# Patient Record
Sex: Female | Born: 2000 | Race: White | Hispanic: No | Marital: Single | Smoking: Never smoker
Health system: Southern US, Community
[De-identification: ages and names within clinical notes are randomized; demographics above are authoritative.]

## PROBLEM LIST (undated history)

## (undated) DIAGNOSIS — J302 Other seasonal allergic rhinitis: Secondary | ICD-10-CM

## (undated) DIAGNOSIS — J4520 Mild intermittent asthma, uncomplicated: Secondary | ICD-10-CM

## (undated) HISTORY — DX: Mild intermittent asthma, uncomplicated: J45.20

## (undated) HISTORY — PX: FRACTURE SURGERY: SHX138

## (undated) HISTORY — PX: ELBOW SURGERY: SHX618

## (undated) HISTORY — DX: Other seasonal allergic rhinitis: J30.2

---
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
## 2021-03-20 ENCOUNTER — Other Ambulatory Visit: Payer: Self-pay | Admitting: Nurse Practitioner

## 2021-03-20 NOTE — Telephone Encounter (Signed)
Attempted to call patient per protocol- patient is over appointment. Left message to call office- patient does have an appointment scheduled with provider closer to her home 03/27/21. Need to verify care plan for possible bridge Rx to appointment- or make appointment in office.

## 2021-03-20 NOTE — Telephone Encounter (Signed)
Unable to send courtesy 28 day RF-original Rx as print.

## 2021-03-20 NOTE — Telephone Encounter (Signed)
Courtesy 28 day cycle given

## 2021-03-27 ENCOUNTER — Ambulatory Visit: Payer: Medicaid Other | Admitting: Internal Medicine

## 2021-05-20 ENCOUNTER — Ambulatory Visit: Payer: Medicaid Other | Admitting: Internal Medicine

## 2021-07-14 ENCOUNTER — Other Ambulatory Visit: Payer: Self-pay | Admitting: Nurse Practitioner

## 2021-07-14 NOTE — Telephone Encounter (Signed)
Call to patient - appointment scheduled- courtesy RF 1 cycle given Requested Prescriptions  Pending Prescriptions Disp Refills  . TRI-ESTARYLLA 0.18/0.215/0.25 MG-35 MCG tablet [Pharmacy Med Name: TRI-ESTARYLLA TABLET] 84 tablet 1    Sig: TAKE 1 TABLET BY MOUTH EVERY DAY     OB/GYN:  Contraceptives Failed - 07/14/2021 12:39 PM      Failed - Valid encounter within last 12 months    Recent Outpatient Visits          1 year ago Need for influenza vaccination   [facility] Caro Laroche, DO   1 year ago Acute gastritis, presence of bleeding unspecified, unspecified gastritis type   [facility], Megan P, DO   2 years ago Moderate persistent asthma without complication   [facility] Roosvelt Maser [place], [place]   2 years ago Moderate persistent asthma without complication   [facility] Particia [place], [place]   3 years ago Nausea and vomiting, intractability of vomiting not specified, unspecified vomiting type   [facility], [place], [place]      Future Appointments            In 3 weeks Larae Grooms, NP [facility]           Passed - Last BP in normal range    BP Readings from Last 1 Encounters:  01/17/20 116/81         Passed - Patient is not a smoker

## 2021-07-14 NOTE — Telephone Encounter (Signed)
Call to patient- she has not been able to find provider locally so she schedule annual exam at [facility]. Unable to fill courtesy RF due to print status of Rx- sent to office to see if patient can have 1 cycle of OCP until her appointment.

## 2021-08-04 NOTE — Progress Notes (Deleted)
There were no vitals taken for this visit.   Subjective:    Patient ID: Krystal Carroll, female    DOB: 03-02-2000, 21 y.o.   MRN: [id number]  HPI: Krystal Carroll is a 21 y.o. female presenting on 08/05/2021 for comprehensive medical examination. Current medical complaints include:{Blank single:19197::"none","***"}  She currently lives with: Menopausal Symptoms: {Blank single:19197::"yes","no"}  Depression Screen done today and results listed below:     01/17/2020    3:03 PM 04/11/2018   10:44 AM 11/26/2017    4:36 PM 10/28/2016    3:05 PM 07/22/2016   12:57 PM  Depression screen PHQ 2/9  Decreased Interest 0 1 2 2 2   Down, Depressed, Hopeless 0 2 3 3 3   PHQ - 2 Score 0 3 5 5 5   Altered sleeping 2 3 1 2 3   Tired, decreased energy 0 3 3 3 3   Change in appetite 3 2 3 3 3   Feeling bad or failure about yourself  1 2 2 3 3   Trouble concentrating 3 1 2 3 2   Moving slowly or fidgety/restless 0 2 3 3 3   Suicidal thoughts 0 1 1 2 3   PHQ-9 Score 9 17 20 24 25   Difficult doing work/chores Very difficult        The patient {has/does not have:19849} a history of falls. I {did/did not:19850} complete a risk assessment for falls. A plan of care for falls {was/was not:19852} documented.   Past Medical History:  Past Medical History:  Diagnosis Date   Mild intermittent asthma    Seasonal allergies     Surgical History:  Past Surgical History:  Procedure Laterality Date   ELBOW SURGERY Left    FRACTURE SURGERY     broken arm    Medications:  Current Outpatient Medications on File Prior to Visit  Medication Sig   albuterol (PROVENTIL HFA;VENTOLIN HFA) 108 (90 Base) MCG/ACT inhaler Inhale 1-2 puffs into the lungs every 6 (six) hours as needed for wheezing or shortness of breath.   Fluticasone-Salmeterol (ADVAIR) 100-50 MCG/DOSE AEPB Inhale 1 puff into the lungs 2 (two) times daily.   omeprazole (PRILOSEC) 20 MG capsule Take 1 capsule (20 mg total) by mouth daily.   ondansetron  (ZOFRAN ODT) 4 MG disintegrating tablet Take 1 tablet (4 mg total) by mouth every 8 (eight) hours as needed for nausea or vomiting.   polyethylene glycol powder (GLYCOLAX/MIRALAX) 17 GM/SCOOP powder Mix one 17gram scoop in one 8oz glass of water. Drink this daily/   sucralfate (CARAFATE) 1 g tablet Take 1 tablet (1 g total) by mouth 4 (four) times daily -  with meals and at bedtime.   TRI-ESTARYLLA 0.18/0.215/0.25 MG-35 MCG tablet TAKE 1 TABLET BY MOUTH EVERY DAY   venlafaxine XR (EFFEXOR-XR) 37.5 MG 24 hr capsule TAKE 1 CAPSULE BY MOUTH DAILY WITH BREAKFAST. (Patient not taking: Reported on 01/17/2020)   No current facility-administered medications on file prior to visit.    Allergies:  Allergies  Allergen Reactions   Claritin [Loratadine] Rash    Social History:  Social History   Socioeconomic History   Marital status: Single    Spouse name: Not on file   Number of children: Not on file   Years of education: Not on file   Highest education level: Not on file  Occupational History   Not on file  Tobacco Use   Smoking status: Passive Smoke Exposure - Never Smoker   Smokeless tobacco: Never  Vaping Use   Vaping Use:  Never used  Substance and Sexual Activity   Alcohol use: No   Drug use: No   Sexual activity: Not on file  Other Topics Concern   Not on file  Social History Narrative   Not on file   Social Determinants of Health   Financial Resource Strain: Not on file  Food Insecurity: Not on file  Transportation Needs: Not on file  Physical Activity: Not on file  Stress: Not on file  Social Connections: Not on file  Intimate Partner Violence: Not on file   Social History   Tobacco Use  Smoking Status Passive Smoke Exposure - Never Smoker  Smokeless Tobacco Never   Social History   Substance and Sexual Activity  Alcohol Use No    Family History:  Family History  Problem Relation Age of Onset   Diabetes Paternal Grandmother    Multiple sclerosis Paternal  Grandmother    Heart disease Neg Hx    Stroke Neg Hx     Past medical history, surgical history, medications, allergies, family history and social history reviewed with patient today and changes made to appropriate areas of the chart.   ROS All other ROS negative except what is listed above and in the HPI.      Objective:    There were no vitals taken for this visit.  Wt Readings from Last 3 Encounters:  01/17/20 145 lb 6.4 oz (66 kg) (79 %, Z= 0.82)*  06/21/19 146 lb (66.2 kg) (81 %, Z= 0.89)*  05/22/19 149 lb (67.6 kg) (84 %, Z= 0.99)*   * Growth percentiles are based on CDC (Girls, 2-20 Years) data.    Physical Exam  Results for orders placed or performed in visit on 01/17/20  CBC with Differential/Platelet  Result Value Ref Range   WBC 8.7 3.4 - 10.8 x10E3/uL   RBC 3.97 3.77 - 5.28 x10E6/uL   Hemoglobin 12.5 11.1 - 15.9 g/dL   Hematocrit 94.4 96.7 - 46.6 %   MCV 93 79 - 97 fL   MCH 31.5 26.6 - 33.0 pg   MCHC 33.9 31.5 - 35.7 g/dL   RDW 59.1 63.8 - 46.6 %   Platelets 223 150 - 450 x10E3/uL   Neutrophils 66 Not Estab. %   Lymphs 28 Not Estab. %   Monocytes 5 Not Estab. %   Eos 1 Not Estab. %   Basos 0 Not Estab. %   Neutrophils Absolute 5.6 1.4 - 7.0 x10E3/uL   Lymphocytes Absolute 2.4 0.7 - 3.1 x10E3/uL   Monocytes Absolute 0.5 0.1 - 0.9 x10E3/uL   EOS (ABSOLUTE) 0.1 0.0 - 0.4 x10E3/uL   Basophils Absolute 0.0 0.0 - 0.2 x10E3/uL   Immature Granulocytes 0 Not Estab. %   Immature Grans (Abs) 0.0 0.0 - 0.1 x10E3/uL  Comprehensive metabolic panel  Result Value Ref Range   Glucose 100 (H) 65 - 99 mg/dL   BUN 10 6 - 20 mg/dL   Creatinine, Ser 5.99 0.57 - 1.00 mg/dL   GFR calc non Af Amer 110 >59 mL/min/1.73   GFR calc Af Amer 126 >59 mL/min/1.73   BUN/Creatinine Ratio 13 9 - 23   Sodium 141 134 - 144 mmol/L   Potassium 4.3 3.5 - 5.2 mmol/L   Chloride 102 96 - 106 mmol/L   CO2 24 20 - 29 mmol/L   Calcium 9.1 8.7 - 10.2 mg/dL   Total Protein 7.1 6.0 - 8.5 g/dL    Albumin 4.2 3.9 - 5.0 g/dL   Globulin, Total  2.9 1.5 - 4.5 g/dL   Albumin/Globulin Ratio 1.4 1.2 - 2.2   Bilirubin Total 0.6 0.0 - 1.2 mg/dL   Alkaline Phosphatase 96 42 - 106 IU/L   AST 12 0 - 40 IU/L   ALT 11 0 - 32 IU/L      Assessment & Plan:   Problem List Items Addressed This Visit   None    Follow up plan: No follow-ups on file.   LABORATORY TESTING:  - Pap smear: {Blank single:19197::"pap done","not applicable","up to date","done elsewhere"}  IMMUNIZATIONS:   - Tdap: Tetanus vaccination status reviewed: {tetanus status:315746}. - Influenza: {Blank single:19197::"Up to date","Administered today","Postponed to flu season","Refused","Given elsewhere"} - Pneumovax: {Blank single:19197::"Up to date","Administered today","Not applicable","Refused","Given elsewhere"} - Prevnar: {Blank single:19197::"Up to date","Administered today","Not applicable","Refused","Given elsewhere"} - COVID: {Blank single:19197::"Up to date","Administered today","Not applicable","Refused","Given elsewhere"} - HPV: {Blank single:19197::"Up to date","Administered today","Not applicable","Refused","Given elsewhere"} - Shingrix vaccine: {Blank single:19197::"Up to date","Administered today","Not applicable","Refused","Given elsewhere"}  SCREENING: -Mammogram: {Blank single:19197::"Up to date","Ordered today","Not applicable","Refused","Done elsewhere"}  - Colonoscopy: {Blank single:19197::"Up to date","Ordered today","Not applicable","Refused","Done elsewhere"}  - Bone Density: {Blank single:19197::"Up to date","Ordered today","Not applicable","Refused","Done elsewhere"}  -Hearing Test: {Blank single:19197::"Up to date","Ordered today","Not applicable","Refused","Done elsewhere"}  -Spirometry: {Blank single:19197::"Up to date","Ordered today","Not applicable","Refused","Done elsewhere"}   PATIENT COUNSELING:   Advised to take 1 mg of folate supplement per day if capable of pregnancy.   Sexuality:  Discussed sexually transmitted diseases, partner selection, use of condoms, avoidance of unintended pregnancy  and contraceptive alternatives.   Advised to avoid cigarette smoking.  I discussed with the patient that most people either abstain from alcohol or drink within safe limits (<=14/week and <=4 drinks/occasion for males, <=7/weeks and <= 3 drinks/occasion for females) and that the risk for alcohol disorders and other health effects rises proportionally with the number of drinks per week and how often a drinker exceeds daily limits.  Discussed cessation/primary prevention of drug use and availability of treatment for abuse.   Diet: Encouraged to adjust caloric intake to maintain  or achieve ideal body weight, to reduce intake of dietary saturated fat and total fat, to limit sodium intake by avoiding high sodium foods and not adding table salt, and to maintain adequate dietary potassium and calcium preferably from fresh fruits, vegetables, and low-fat dairy products.    stressed the importance of regular exercise  Injury prevention: Discussed safety belts, safety helmets, smoke detector, smoking near bedding or upholstery.   Dental health: Discussed importance of regular tooth brushing, flossing, and dental visits.    NEXT PREVENTATIVE PHYSICAL DUE IN 1 YEAR. No follow-ups on file.

## 2021-08-05 ENCOUNTER — Encounter: Payer: Self-pay | Admitting: Nurse Practitioner

## 2022-07-14 ENCOUNTER — Telehealth: Payer: Self-pay

## 2022-07-14 NOTE — Telephone Encounter (Signed)
LVM for patient to call back [phone], or to call PCP office to schedule follow up apt. AS, CMA  

## 2023-04-13 ENCOUNTER — Ambulatory Visit: Payer: Medicaid Other | Admitting: Family Medicine

## 2023-05-20 ENCOUNTER — Encounter: Payer: Self-pay | Admitting: Family Medicine

## 2023-05-20 ENCOUNTER — Ambulatory Visit (INDEPENDENT_AMBULATORY_CARE_PROVIDER_SITE_OTHER): Payer: Medicaid Other | Admitting: Family Medicine

## 2023-05-20 ENCOUNTER — Ambulatory Visit (HOSPITAL_COMMUNITY)
Admission: RE | Admit: 2023-05-20 | Discharge: 2023-05-20 | Disposition: A | Discharge status: Home / Self Care | Source: Ambulatory Visit | Attending: Family Medicine | Admitting: Family Medicine

## 2023-05-20 VITALS — BP 123/82 | HR 73 | Resp 18 | Ht 60.0 in | Wt 112.1 lb

## 2023-05-20 DIAGNOSIS — Z114 Encounter for screening for human immunodeficiency virus [HIV]: Secondary | ICD-10-CM

## 2023-05-20 DIAGNOSIS — R7301 Impaired fasting glucose: Secondary | ICD-10-CM | POA: Diagnosis not present

## 2023-05-20 DIAGNOSIS — M545 Low back pain, unspecified: Secondary | ICD-10-CM | POA: Diagnosis present

## 2023-05-20 DIAGNOSIS — E559 Vitamin D deficiency, unspecified: Secondary | ICD-10-CM | POA: Diagnosis not present

## 2023-05-20 DIAGNOSIS — Z1159 Encounter for screening for other viral diseases: Secondary | ICD-10-CM

## 2023-05-20 DIAGNOSIS — E038 Other specified hypothyroidism: Secondary | ICD-10-CM

## 2023-05-20 DIAGNOSIS — E7849 Other hyperlipidemia: Secondary | ICD-10-CM

## 2023-05-20 NOTE — Progress Notes (Signed)
 Established Patient Office Visit  Subjective:  Patient ID: Krystal Carroll, female    DOB: 01/16/2001  Age: 23 y.o. MRN: [id number]  CC:  Chief Complaint  Patient presents with   Establish Care    New pt appt    Back Pain    Complains of intermittent sharp pain in lower back which radiates in both legs. Had an injury in 2021, states the pain has been getting more severe.      HPI Krystal Carroll is a 23 y.o. female with past medical history of asthma presents for f/u of  chronic medical conditions. For the details of today's visit, please refer to the assessment and plan.     Past Medical History:  Diagnosis Date   Mild intermittent asthma    Seasonal allergies     Past Surgical History:  Procedure Laterality Date   ELBOW SURGERY Left    FRACTURE SURGERY     broken arm    Family History  Problem Relation Age of Onset   Diabetes Paternal Grandmother    Multiple sclerosis Paternal Grandmother    Heart disease Neg Hx    Stroke Neg Hx     Social History   Socioeconomic History   Marital status: Single    Spouse name: Not on file   Number of children: Not on file   Years of education: Not on file   Highest education level: Not on file  Occupational History   Not on file  Tobacco Use   Smoking status: Never    Passive exposure: Yes   Smokeless tobacco: Never  Vaping Use   Vaping status: Never Used  Substance and Sexual Activity   Alcohol use: Yes    Comment: occasional   Drug use: Yes    Types: Marijuana   Sexual activity: Yes  Other Topics Concern   Not on file  Social History Narrative   Not on file   Social Drivers of Health   Financial Resource Strain: Not on file  Food Insecurity: No Food Insecurity (05/20/2023)   Hunger Vital Sign    Worried About Running Out of Food in the Last Year: Never true    Ran Out of Food in the Last Year: Never true  Transportation Needs: Unmet Transportation Needs (05/20/2023)   PRAPARE - Transportation    Lack of  Transportation (Medical): Yes    Lack of Transportation (Non-Medical): Yes  Physical Activity: Inactive (05/20/2023)   Exercise Vital Sign    Days of Exercise per Week: 0 days    Minutes of Exercise per Session: 0 min  Stress: Not on file  Social Connections: Not on file  Intimate Partner Violence: Not At Risk (05/20/2023)   Humiliation, Afraid, Rape, and Kick questionnaire    Fear of Current or Ex-Partner: No    Emotionally Abused: No    Physically Abused: No    Sexually Abused: No    Outpatient Medications Prior to Visit  Medication Sig Dispense Refill   albuterol  (PROVENTIL  HFA;VENTOLIN  HFA) 108 (90 Base) MCG/ACT inhaler Inhale 1-2 puffs into the lungs every 6 (six) hours as needed for wheezing or shortness of breath. (Patient not taking: Reported on 05/20/2023) 1 Inhaler 0   Fluticasone -Salmeterol (ADVAIR) 100-50 MCG/DOSE AEPB Inhale 1 puff into the lungs 2 (two) times daily. (Patient not taking: Reported on 05/20/2023) 1 each 5   omeprazole  (PRILOSEC) 20 MG capsule Take 1 capsule (20 mg total) by mouth daily. (Patient not taking: Reported on 05/20/2023) 30  capsule 3   ondansetron  (ZOFRAN  ODT) 4 MG disintegrating tablet Take 1 tablet (4 mg total) by mouth every 8 (eight) hours as needed for nausea or vomiting. (Patient not taking: Reported on 05/20/2023) 20 tablet 1   polyethylene glycol powder (GLYCOLAX /MIRALAX ) 17 GM/SCOOP powder Mix one 17gram scoop in one 8oz glass of water. Drink this daily/ (Patient not taking: Reported on 05/20/2023) 578 g 1   sucralfate  (CARAFATE ) 1 g tablet Take 1 tablet (1 g total) by mouth 4 (four) times daily -  with meals and at bedtime. (Patient not taking: Reported on 05/20/2023) 120 tablet 1   TRI-ESTARYLLA 0.18/0.215/0.25 MG-35 MCG tablet TAKE 1 TABLET BY MOUTH EVERY DAY (Patient not taking: Reported on 05/20/2023) 28 tablet 0   venlafaxine  XR (EFFEXOR -XR) 37.5 MG 24 hr capsule TAKE 1 CAPSULE BY MOUTH DAILY WITH BREAKFAST. (Patient not taking: Reported on  05/20/2023) 10 capsule 0   No facility-administered medications prior to visit.    Allergies  Allergen Reactions   Claritin [Loratadine] Rash    ROS Review of Systems  Constitutional:  Negative for chills and fever.  Eyes:  Negative for visual disturbance.  Respiratory:  Negative for chest tightness and shortness of breath.   Musculoskeletal:  Positive for back pain.  Neurological:  Negative for dizziness and headaches.      Objective:    Physical Exam HENT:     Head: Normocephalic.     Mouth/Throat:     Mouth: Mucous membranes are moist.  Cardiovascular:     Rate and Rhythm: Normal rate.     Heart sounds: Normal heart sounds.  Pulmonary:     Effort: Pulmonary effort is normal.     Breath sounds: Normal breath sounds.  Musculoskeletal:     Lumbar back: No tenderness. Negative right straight leg raise test and negative left straight leg raise test.  Neurological:     Mental Status: She is alert.     BP 123/82   Pulse 73   Resp 18   Ht 5' (1.524 m)   Wt 112 lb 1.9 oz (50.9 kg)   LMP 05/13/2023 (Exact Date)   SpO2 98%   BMI 21.90 kg/m  Wt Readings from Last 3 Encounters:  05/20/23 112 lb 1.9 oz (50.9 kg)  01/17/20 145 lb 6.4 oz (66 kg) (79%, Z= 0.82)*  06/21/19 146 lb (66.2 kg) (81%, Z= 0.89)*   * Growth percentiles are based on CDC (Girls, 2-20 Years) data.    No results found for: "TSH" Lab Results  Component Value Date   WBC 8.7 01/17/2020   HGB 12.5 01/17/2020   HCT 36.9 01/17/2020   MCV 93 01/17/2020   PLT 223 01/17/2020   Lab Results  Component Value Date   NA 141 01/17/2020   K 4.3 01/17/2020   CO2 24 01/17/2020   GLUCOSE 100 (H) 01/17/2020   BUN 10 01/17/2020   CREATININE 0.79 01/17/2020   BILITOT 0.6 01/17/2020   ALKPHOS 96 01/17/2020   AST 12 01/17/2020   ALT 11 01/17/2020   PROT 7.1 01/17/2020   ALBUMIN 4.2 01/17/2020   CALCIUM 9.1 01/17/2020   ANIONGAP 3 (L) 05/18/2013   No results found for: "CHOL" No results found for:  "HDL" No results found for: "LDLCALC" No results found for: "TRIG" No results found for: "CHOLHDL" No results found for: "HGBA1C"    Assessment & Plan:  Lumbar pain Assessment & Plan: The patient reports a long-standing history of lower back pain since 2021, which began  after completing a 5-mile walk. She has a family history of rheumatoid arthritis and is concerned she may have a similar condition. The pain is described at times as dull, sharp, and occasionally radiating down the lower leg. She denies any recent injury or trauma and reports no lower back pain today in the clinic. A previously completed rheumatoid panel was negative.  An imaging study of the lower back will be obtained to rule out structural abnormalities. Nonpharmacological interventions were discussed, including the application of heat, the use of topical therapies, rest, and activity modification. No red flag symptoms (e.g., bowel/bladder dysfunction, progressive weakness, fever, or unexplained weight loss) were reported at this time.   Orders: -     DG Lumbar Spine Complete -     Ambulatory referral to Physical Therapy -     Rheumatoid factor  IFG (impaired fasting glucose) -     Hemoglobin A1c  Vitamin D deficiency -     VITAMIN D 25 Hydroxy (Vit-D Deficiency, Fractures)  Need for hepatitis C screening test -     Hepatitis C antibody  Encounter for screening for HIV -     HIV Antibody (routine testing w rflx)  TSH (thyroid-stimulating hormone deficiency) -     TSH + free T4  Other hyperlipidemia -     Lipid panel -     CMP14+EGFR -     CBC with Differential/Platelet  Note: This chart has been completed using Engineer, civil (consulting) software, and while attempts have been made to ensure accuracy, certain words and phrases may not be transcribed as intended.    Follow-up: Return in about 1 month (around 06/19/2023) for pap smear.   Paidyn Mcferran, FNP

## 2023-05-20 NOTE — Patient Instructions (Addendum)
 I appreciate the opportunity to provide care to you today!    Follow up:  1 months for pap smear  Labs: please stop by the lab today to get your blood drawn (CBC, CMP, TSH, Lipid profile, HgA1c, Vit D)  Screening: HIV and Hep C  Low back pain Please stop by [facility] to get a x-ray of your lower back  Here are several nonpharmacological interventions for managing back pain: -Physical Activity: Engage in regular low-impact exercises such as walking, swimming, or cycling to strengthen the back muscles and improve flexibility. -Stretching and Strengthening Exercises: Incorporate exercises that focus on strengthening the core and back muscles, as well as stretches to improve flexibility. Specific exercises can include: Cat-Cow stretch Child's pose Knee-to-chest stretch Hamstring stretches Hot and Cold Therapy: -Cold Therapy: Apply ice packs to the affected area for 15-20 minutes to reduce inflammation and numb the pain. -Heat Therapy: After the initial inflammation has subsided, use heat packs or warm baths to relax tight muscles and improve circulation. -Posture Improvement: Maintain proper posture while sitting, standing, and lifting to avoid additional strain on the back. Ergonomic chairs and lumbar support can help. -Weight Management: Maintain a healthy weight to reduce stress on the spine and lower back. -Sleep Hygiene: Ensure proper sleep positioning by using supportive pillows and mattresses to maintain spinal alignment during sleep.   Referrals today-  Physical therapy  Attached with your AVS, you will find valuable resources for self-education. I highly recommend dedicating some time to thoroughly examine them.   Please continue to a heart-healthy diet and increase your physical activities. Try to exercise for at least five days a week.    It was a pleasure to see you and I look forward to continuing to work together on your health and well-being. Please do not  hesitate to call the office if you need care or have questions about your care.  In case of emergency, please visit the Emergency Department for urgent care, or contact our clinic at [phone] to schedule an appointment. We're here to help you!   Have a wonderful day and week. With Gratitude, Latravious Levitt MSN, FNP-BC

## 2023-05-20 NOTE — Assessment & Plan Note (Signed)
 The patient reports a long-standing history of lower back pain since 2021, which began after completing a 5-mile walk. She has a family history of rheumatoid arthritis and is concerned she may have a similar condition. The pain is described at times as dull, sharp, and occasionally radiating down the lower leg. She denies any recent injury or trauma and reports no lower back pain today in the clinic. A previously completed rheumatoid panel was negative.  An imaging study of the lower back will be obtained to rule out structural abnormalities. Nonpharmacological interventions were discussed, including the application of heat, the use of topical therapies, rest, and activity modification. No red flag symptoms (e.g., bowel/bladder dysfunction, progressive weakness, fever, or unexplained weight loss) were reported at this time.

## 2023-05-21 ENCOUNTER — Encounter: Payer: Self-pay | Admitting: Family Medicine

## 2023-05-21 ENCOUNTER — Other Ambulatory Visit: Payer: Self-pay | Admitting: Family Medicine

## 2023-05-21 DIAGNOSIS — E559 Vitamin D deficiency, unspecified: Secondary | ICD-10-CM

## 2023-05-21 LAB — CBC WITH DIFFERENTIAL/PLATELET
Basophils Absolute: 0.1 x10E3/uL (ref 0.0–0.2)
Basos: 1 %
EOS (ABSOLUTE): 0.2 x10E3/uL (ref 0.0–0.4)
Eos: 2 %
Hematocrit: 38.9 % (ref 34.0–46.6)
Hemoglobin: 12.8 g/dL (ref 11.1–15.9)
Immature Grans (Abs): 0.1 x10E3/uL (ref 0.0–0.1)
Immature Granulocytes: 1 %
Lymphocytes Absolute: 1.9 x10E3/uL (ref 0.7–3.1)
Lymphs: 27 %
MCH: 30.9 pg (ref 26.6–33.0)
MCHC: 32.9 g/dL (ref 31.5–35.7)
MCV: 94 fL (ref 79–97)
Monocytes Absolute: 0.5 x10E3/uL (ref 0.1–0.9)
Monocytes: 7 %
Neutrophils Absolute: 4.3 x10E3/uL (ref 1.4–7.0)
Neutrophils: 62 %
Platelets: 244 x10E3/uL (ref 150–450)
RBC: 4.14 x10E6/uL (ref 3.77–5.28)
RDW: 12.7 % (ref 11.7–15.4)
WBC: 7 x10E3/uL (ref 3.4–10.8)

## 2023-05-21 LAB — LIPID PANEL
Chol/HDL Ratio: 2.8 ratio (ref 0.0–4.4)
Cholesterol, Total: 147 mg/dL (ref 100–199)
HDL: 52 mg/dL (ref >39)
LDL Chol Calc (NIH): 83 mg/dL (ref 0–99)
Triglycerides: 59 mg/dL (ref 0–149)
VLDL Cholesterol Cal: 12 mg/dL (ref 5–40)

## 2023-05-21 LAB — CMP14+EGFR
ALT: 17 IU/L (ref 0–32)
AST: 16 IU/L (ref 0–40)
Albumin: 4.5 g/dL (ref 4.0–5.0)
Alkaline Phosphatase: 93 IU/L (ref 44–121)
BUN/Creatinine Ratio: 16 (ref 9–23)
BUN: 12 mg/dL (ref 6–20)
Bilirubin Total: 1.7 mg/dL — ABNORMAL HIGH (ref 0.0–1.2)
CO2: 24 mmol/L (ref 20–29)
Calcium: 9.5 mg/dL (ref 8.7–10.2)
Chloride: 102 mmol/L (ref 96–106)
Creatinine, Ser: 0.73 mg/dL (ref 0.57–1.00)
Globulin, Total: 2.8 g/dL (ref 1.5–4.5)
Glucose: 80 mg/dL (ref 70–99)
Potassium: 4.7 mmol/L (ref 3.5–5.2)
Sodium: 140 mmol/L (ref 134–144)
Total Protein: 7.3 g/dL (ref 6.0–8.5)
eGFR: 120 mL/min/1.73 (ref >59)

## 2023-05-21 LAB — TSH+FREE T4
Free T4: 1.1 ng/dL (ref 0.82–1.77)
TSH: 3.61 u[IU]/mL (ref 0.450–4.500)

## 2023-05-21 LAB — HEMOGLOBIN A1C
Est. average glucose Bld gHb Est-mCnc: 103 mg/dL
Hgb A1c MFr Bld: 5.2 % (ref 4.8–5.6)

## 2023-05-21 LAB — HIV ANTIBODY (ROUTINE TESTING W REFLEX): HIV Screen 4th Generation wRfx: NONREACTIVE

## 2023-05-21 LAB — HEPATITIS C ANTIBODY: Hep C Virus Ab: NONREACTIVE

## 2023-05-21 LAB — VITAMIN D 25 HYDROXY (VIT D DEFICIENCY, FRACTURES): Vit D, 25-Hydroxy: 14 ng/mL — ABNORMAL LOW (ref 30.0–100.0)

## 2023-05-21 MED ORDER — VITAMIN D (ERGOCALCIFEROL) 1.25 MG (50000 UNIT) PO CAPS: 50000.0000 [IU] | ORAL_CAPSULE | ORAL | 1 refills | Status: AC

## 2023-05-28 ENCOUNTER — Encounter: Payer: Self-pay | Admitting: Family Medicine

## 2023-07-08 ENCOUNTER — Ambulatory Visit (HOSPITAL_COMMUNITY): Attending: Family Medicine

## 2023-07-08 ENCOUNTER — Other Ambulatory Visit: Payer: Self-pay

## 2023-07-08 ENCOUNTER — Encounter (HOSPITAL_COMMUNITY): Payer: Self-pay

## 2023-07-08 DIAGNOSIS — M545 Low back pain, unspecified: Secondary | ICD-10-CM | POA: Insufficient documentation

## 2023-07-08 DIAGNOSIS — M5459 Other low back pain: Secondary | ICD-10-CM | POA: Diagnosis present

## 2023-07-08 DIAGNOSIS — Z7409 Other reduced mobility: Secondary | ICD-10-CM | POA: Diagnosis present

## 2023-07-08 DIAGNOSIS — R29898 Other symptoms and signs involving the musculoskeletal system: Secondary | ICD-10-CM | POA: Diagnosis present

## 2023-07-08 NOTE — Therapy (Signed)
 OUTPATIENT PHYSICAL THERAPY THORACOLUMBAR EVALUATION   Patient Name: Krystal Carroll MRN: [id number] DOB:2000/09/14, 23 y.o., female Today's Date: 07/08/2023  END OF SESSION:  PT End of Session - 07/08/23 1447     Visit Number 1    Date for PT Re-Evaluation 08/20/23    Authorization Type [place] MEDICAID [employer]    Authorization Time Period seeking auth    Authorization - Visit Number 0    Progress Note Due on Visit 10    PT Start Time 1300    PT Stop Time 1344    PT Time Calculation (min) 44 min    Activity Tolerance Patient tolerated treatment well;Patient limited by pain    Behavior During Therapy [facility] for tasks assessed/performed          Past Medical History:  Diagnosis Date   Mild intermittent asthma    Seasonal allergies    Past Surgical History:  Procedure Laterality Date   ELBOW SURGERY Left    FRACTURE SURGERY     broken arm   Patient Active Problem List   Diagnosis Date Noted   Lumbar pain 05/20/2023   GERD (gastroesophageal reflux disease) 01/17/2020   Seasonal allergies 06/21/2019   Encounter for surveillance of contraceptive pills 06/04/2019   Insomnia 11/26/2017   Depression, recurrent (HCC) 07/24/2016   Moderate asthma     PCP: Zarwolo, Gloria, FNP  REFERRING PROVIDER: Zarwolo, Gloria, FNP  REFERRING DIAG: M54.50 (ICD-10-CM) - Lumbar pain  Rationale for Evaluation and Treatment: Rehabilitation  THERAPY DIAG:  Other low back pain  Weakness of both lower extremities  Impaired functional mobility and activity tolerance  ONSET DATE: Years  SUBJECTIVE:                                                                                                                                                                                           SUBJECTIVE STATEMENT: Pt states that she has dealt with back pain since middle school. Vitamin D2 just added to medication list. Pt states she was working at [employer] and had to lift stuff  over head, now pt works at [employer] and its better but has to stand for long periods. Pt mentions sciatica was a word her doctor used last appointment. Pain in left leg today but has gone down right leg before. Pt states sciatica pain started on a trip where she had to walk over 5 miles, this is when she started complaining about the pain everyday. Significant other states pain is the worst in the morning, and after work at night. Pt asks about best sleeping position.  PERTINENT HISTORY:  -Broke arm twice,  abnormal sensation in LUE  PAIN:  Are you having pain? Yes: NPRS scale: 3/10, 7/10 at worst Pain location: left leg Pain description: burning, shooting Aggravating factors: sleeping position, long walking, going up hill,  Relieving factors: Advil, rest  PRECAUTIONS: None  RED FLAGS: None   WEIGHT BEARING RESTRICTIONS: No  FALLS:  Has patient fallen in last 6 months? No   OCCUPATION: [employer]  PLOF: Independent  PATIENT GOALS: Pt would like to be able to walk and stand longer, work with out pain, back to normal. Would like to return to   NEXT MD VISIT: July  OBJECTIVE:  Note: Objective measures were completed at Evaluation unless otherwise noted.  DIAGNOSTIC FINDINGS:  CLINICAL DATA:  Chronic low back pain for 2 years.   EXAM: LUMBAR SPINE - COMPLETE 4+ VIEW   COMPARISON:  None Available.   FINDINGS: There is no evidence of lumbar spine fracture. Mild scoliosis. Intervertebral disc spaces are maintained.   IMPRESSION: No acute fracture or dislocation. Mild scoliosis.  PATIENT SURVEYS:  Modified Oswestry 19/50      SENSATION: Pt states numbness in left UE and occasional LLE during sciatica flare ups.   POSTURE: rounded shoulders and forward head  PALPATION: Increased tenderness noted to palpation of L1-L5 lumbar segments although no tenderness noted in lumbar paraspinals  LUMBAR ROM:   AROM eval  Flexion 70  Extension 10, pain in mid/lower back  Right  lateral flexion 25  Left lateral flexion 25  Right rotation Full ROM  Left rotation Full ROM    (Blank rows = not tested)  LOWER EXTREMITY ROM:     Active  Right eval Left eval  Hip flexion    Hip extension    Hip abduction    Hip adduction    Hip internal rotation    Hip external rotation    Knee flexion    Knee extension    Ankle dorsiflexion    Ankle plantarflexion    Ankle inversion    Ankle eversion     (Blank rows = not tested)  LOWER EXTREMITY MMT:    MMT Right eval Left eval  Hip flexion 4 3, pain  Hip extension 4 4  Hip abduction 3+ 3+  Hip adduction    Hip internal rotation    Hip external rotation    Knee flexion    Knee extension    Ankle dorsiflexion    Ankle plantarflexion    Ankle inversion    Ankle eversion     (Blank rows = not tested)  LUMBAR SPECIAL TESTS:  Straight leg raise test: Negative and Slump test: Negative  FUNCTIONAL TESTS:  5 times sit to stand: 17.02 2 minute walk test: 375 feet  GAIT: Distance walked: 375 feet Assistive device utilized: None Level of assistance: Complete Independence Comments: pt demonstrates decreased arm swing bilaterally, increased knee valgus and toe in compensation on occasion bilaterally, pt reports increased pain at end of but does not demonstrate asymmetrical gait.  TREATMENT DATE:  07/08/2023  Evaluation: -ROM measured, Strength assessed, HEP prescribed, pt educated on prognosis, findings, and importance of HEP compliance if given.  PATIENT EDUCATION:  Education details: Pt was educated on findings of PT evaluation, prognosis, frequency of therapy visits and rationale, attendance policy, and HEP if given.   Person educated: Patient Education method: Explanation, Verbal cues, and Handouts Education comprehension: verbalized understanding, tactile cues required,  and needs further education  HOME EXERCISE PROGRAM: Access Code: [email or web address] URL: [email or web address] Date: 07/08/2023 Prepared by: Armond Bertin  Exercises - Supine Bridge  - 1 x daily - 7 x weekly - 3 sets - 10 reps - Supine Lower Trunk Rotation  - 1 x daily - 7 x weekly - 3 sets - 10 reps - Sit to Stand Without Arm Support  - 1 x daily - 7 x weekly - 3 sets - 10 reps  ASSESSMENT:  CLINICAL IMPRESSION: Patient is a 23 y.o. female who was seen today for physical therapy evaluation and treatment for M54.50 (ICD-10-CM) - Lumbar pain.   Patient demonstrates increased low back pain, decreased LE/core strength, abnormal gait pattern, and impaired balance. Patient also demonstrates difficulty with ambulation during today's session with decreased arm swing bilaterally, increased knee valgus and  toe in compensations, and decreased velocity noted. Patient also demonstrates LLE pain with extension and HEP prescription. Pt requires education on POC, role of PT, and best sleeping positions. Patient would benefit from skilled physical therapy for increased endurance with ambulation, increased LE/core strength, and increased activity tolerance for improved gait quality, return to higher level of function with ADLs, and progress towards therapy goals.   OBJECTIVE IMPAIRMENTS: Abnormal gait, decreased activity tolerance, decreased balance, decreased endurance, decreased knowledge of use of DME, decreased mobility, difficulty walking, decreased ROM, decreased strength, and pain.   ACTIVITY LIMITATIONS: carrying, lifting, bending, sitting, and squatting  PARTICIPATION LIMITATIONS: meal prep, cleaning, laundry, shopping, community activity, occupation, and yard work  PERSONAL FACTORS: Age, Fitness, Past/current experiences, and Time since onset of injury/illness/exacerbation are also affecting patient's functional outcome.   REHAB POTENTIAL: Good  CLINICAL DECISION MAKING:  Stable/uncomplicated  EVALUATION COMPLEXITY: Low   GOALS: Goals reviewed with patient? No  SHORT TERM GOALS: Target date: 07/29/23  Pt will be independent with HEP in order to demonstrate participation in Physical Therapy POC.  Baseline: Goal status: INITIAL  2.  Pt will report 5/10 pain at worst in last week with mobility in order to demonstrate improved pain with ADLs.  Baseline:  Goal status: INITIAL  LONG TERM GOALS: Target date: 08/19/23  Pt will improve 5TSTS by 2.3 seconds in order to demonstrate improved functional strength to return to desired activities.  Baseline: see objective.  Goal status: INITIAL  2.  Pt will improve 2 MWT by 40 feet in order to demonstrate improved functional ambulatory capacity in community setting.  Baseline: see objective.  Goal status: INITIAL  3.  Pt will improve Modified Oswestry score by 6 points in order to demonstrate improved pain with functional goals and outcomes. Baseline: see objective.  Goal status: INITIAL  4.  Pt will report 3/10 pain at worst in last week with mobility in order to demonstrate reduced pain with ADLs lasting greater than 30 minutes.  Baseline: see objective.  Goal status: INITIAL   PLAN:  PT FREQUENCY: 1-2x/week  PT DURATION: 6 weeks  PLANNED INTERVENTIONS: 97110-Therapeutic exercises, 97530- Therapeutic activity, 97112- Neuromuscular re-education, 97535- Self Care, 16109- Manual therapy, [phone]- Gait training, Patient/Family education, Balance training, Stair training, Joint mobilization, Spinal mobilization, DME instructions, Cryotherapy, and Moist heat.  PLAN FOR NEXT SESSION: Assess hip AROM and OP, assess SLS balance, progress  core and LE strengthening, review goals and HEP   Armond Bertin, PT, DPT [facility] Office: [phone] 3:18 PM, 07/08/23   Managed Medicaid Authorization Request  Visit Dx Codes: A21.30 ; R29.898 ;  Z74.09   Functional Tool Score: Modified  Oswestry 19/50   For all possible CPT codes, reference the Planned Interventions line above.     Check all conditions that are expected to impact treatment: {Conditions expected to impact treatment:Structural or anatomic abnormalities   If treatment provided at initial evaluation, no treatment charged due to lack of authorization.

## 2023-07-13 ENCOUNTER — Ambulatory Visit (HOSPITAL_COMMUNITY)

## 2023-07-13 DIAGNOSIS — R29898 Other symptoms and signs involving the musculoskeletal system: Secondary | ICD-10-CM

## 2023-07-13 DIAGNOSIS — Z7409 Other reduced mobility: Secondary | ICD-10-CM

## 2023-07-13 DIAGNOSIS — M5459 Other low back pain: Secondary | ICD-10-CM | POA: Diagnosis not present

## 2023-07-13 NOTE — Therapy (Signed)
 OUTPATIENT PHYSICAL THERAPY THORACOLUMBAR EVALUATION   Patient Name: Krystal Carroll MRN: [id number] DOB:28-Apr-2000, 23 y.o., female Today's Date: 07/13/2023  END OF SESSION:  PT End of Session - 07/13/23 0950     Visit Number 2    Number of Visits 12    Date for PT Re-Evaluation 08/20/23    Authorization Type [place] MEDICAID [employer]    Authorization Time Period seeking auth    Progress Note Due on Visit 10    PT Start Time [phone]   late arrival   PT Stop Time 1015    PT Time Calculation (min) 29 min    Activity Tolerance Patient tolerated treatment well;Patient limited by pain    Behavior During Therapy [facility] for tasks assessed/performed           Past Medical History:  Diagnosis Date   Mild intermittent asthma    Seasonal allergies    Past Surgical History:  Procedure Laterality Date   ELBOW SURGERY Left    FRACTURE SURGERY     broken arm   Patient Active Problem List   Diagnosis Date Noted   Lumbar pain 05/20/2023   GERD (gastroesophageal reflux disease) 01/17/2020   Seasonal allergies 06/21/2019   Encounter for surveillance of contraceptive pills 06/04/2019   Insomnia 11/26/2017   Depression, recurrent (HCC) 07/24/2016   Moderate asthma     PCP: Zarwolo, Gloria, FNP  REFERRING PROVIDER: Zarwolo, Gloria, FNP  REFERRING DIAG: M54.50 (ICD-10-CM) - Lumbar pain  Rationale for Evaluation and Treatment: Rehabilitation  THERAPY DIAG:  Other low back pain  Weakness of both lower extremities  Impaired functional mobility and activity tolerance  ONSET DATE: Years  SUBJECTIVE:                                                                                                                                                                                           SUBJECTIVE STATEMENT: Pain is only in her back now; not in her legs today.  No pain on arrival today.  No questions about her HEP   Pt states that she has dealt with back pain since  middle school. Vitamin D2 just added to medication list. Pt states she was working at [employer] and had to lift stuff over head, now pt works at [employer] and its better but has to stand for long periods. Pt mentions sciatica was a word her doctor used last appointment. Pain in left leg today but has gone down right leg before. Pt states sciatica pain started on a trip where she had to walk over 5 miles, this is when she started complaining about the pain  everyday. Significant other states pain is the worst in the morning, and after work at night. Pt asks about best sleeping position.  PERTINENT HISTORY:  -Broke arm twice, abnormal sensation in LUE  PAIN:  Are you having pain? Yes: NPRS scale: 3/10, 7/10 at worst Pain location: left leg Pain description: burning, shooting Aggravating factors: sleeping position, long walking, going up hill,  Relieving factors: Advil, rest  PRECAUTIONS: None  RED FLAGS: None   WEIGHT BEARING RESTRICTIONS: No  FALLS:  Has patient fallen in last 6 months? No   OCCUPATION: [employer]  PLOF: Independent  PATIENT GOALS: Pt would like to be able to walk and stand longer, work with out pain, back to normal. Would like to return to   NEXT MD VISIT: July  OBJECTIVE:  Note: Objective measures were completed at Evaluation unless otherwise noted.  DIAGNOSTIC FINDINGS:  CLINICAL DATA:  Chronic low back pain for 2 years.   EXAM: LUMBAR SPINE - COMPLETE 4+ VIEW   COMPARISON:  None Available.   FINDINGS: There is no evidence of lumbar spine fracture. Mild scoliosis. Intervertebral disc spaces are maintained.   IMPRESSION: No acute fracture or dislocation. Mild scoliosis.  PATIENT SURVEYS:  Modified Oswestry 19/50      SENSATION: Pt states numbness in left UE and occasional LLE during sciatica flare ups.   POSTURE: rounded shoulders and forward head  PALPATION: Increased tenderness noted to palpation of L1-L5 lumbar segments although no  tenderness noted in lumbar paraspinals  LUMBAR ROM:   AROM eval  Flexion 70  Extension 10, pain in mid/lower back  Right lateral flexion 25  Left lateral flexion 25  Right rotation Full ROM  Left rotation Full ROM    (Blank rows = not tested)  LOWER EXTREMITY ROM:     Active  Right eval Left eval  Hip flexion    Hip extension    Hip abduction    Hip adduction    Hip internal rotation    Hip external rotation    Knee flexion    Knee extension    Ankle dorsiflexion    Ankle plantarflexion    Ankle inversion    Ankle eversion     (Blank rows = not tested)  LOWER EXTREMITY MMT:    MMT Right eval Left eval  Hip flexion 4 3, pain  Hip extension 4 4  Hip abduction 3+ 3+  Hip adduction    Hip internal rotation    Hip external rotation    Knee flexion    Knee extension    Ankle dorsiflexion    Ankle plantarflexion    Ankle inversion    Ankle eversion     (Blank rows = not tested)  LUMBAR SPECIAL TESTS:  Straight leg raise test: Negative and Slump test: Negative  FUNCTIONAL TESTS:  5 times sit to stand: 17.02 2 minute walk test: 375 feet  GAIT: Distance walked: 375 feet Assistive device utilized: None Level of assistance: Complete Independence Comments: pt demonstrates decreased arm swing bilaterally, increased knee valgus and toe in compensation on occasion bilaterally, pt reports increased pain at end of but does not demonstrate asymmetrical gait.  TREATMENT DATE:  07/13/23 Supine: LTR x 10 x 5 Bridge 5 hold x 10 Hip abduction in hooklying with green theraband x 10 Hip adduction with 5 hold with bridge x 10 SLS 30 each leg no UE assist Seated scap retraction 5 hold x 10 Updated HEP     07/08/2023  Evaluation: -ROM measured,  Strength assessed, HEP prescribed, pt educated on prognosis, findings, and importance of HEP compliance if given.                                                                                                                                   PATIENT EDUCATION:  Education details: Pt was educated on findings of PT evaluation, prognosis, frequency of therapy visits and rationale, attendance policy, and HEP if given.   Person educated: Patient Education method: Explanation, Verbal cues, and Handouts Education comprehension: verbalized understanding, tactile cues required, and needs further education  HOME EXERCISE PROGRAM: Access Code: [email or web address] URL: [email or web address] Date: 07/13/2023  Exercises - Supine Bridge with Mini Swiss Ball Between Knees  - 2 x daily - 7 x weekly - 2 sets - 10 reps - 5 sec hold - Hooklying Clamshell with Resistance  - 2 x daily - 7 x weekly - 2 sets - 10 reps - Seated Scapular Retraction  - 2 x daily - 7 x weekly - 1 sets - 10 reps - 5 sec hold  Access Code: [email or web address] URL: [email or web address] Date: 07/08/2023 Prepared by: Armond Bertin  Exercises - Supine Bridge  - 1 x daily - 7 x weekly - 3 sets - 10 reps - Supine Lower Trunk Rotation  - 1 x daily - 7 x weekly - 3 sets - 10 reps - Sit to Stand Without Arm Support  - 1 x daily - 7 x weekly - 3 sets - 10 reps  ASSESSMENT:  CLINICAL IMPRESSION: Late arrival.  Today's session with focus on review of HEP and progressing core strengthening exercise. Updated HEP.  Patient demonstrates good SLS balance so no significant issue there.  Needs occasional cues for proper breathing during exercise but otherwise progressing well.  No reports of pain throughout treatment.  Patient will benefit from continued skilled therapy services to address deficits and promote return to optimal function.       Eval: Patient is a 23 y.o. female who was seen today for physical therapy evaluation and treatment for M54.50 (ICD-10-CM) - Lumbar pain.  Patient demonstrates increased low back pain, decreased LE/core strength, abnormal gait pattern, and impaired balance. Patient also demonstrates difficulty with ambulation  during today's session with decreased arm swing bilaterally, increased knee valgus and  toe in compensations, and decreased velocity noted. Patient also demonstrates LLE pain with extension and HEP prescription. Pt requires education on POC, role of PT, and best sleeping positions. Patient would benefit from skilled physical therapy for increased endurance with ambulation, increased LE/core strength, and increased activity tolerance for improved gait quality, return to higher level of function with ADLs, and progress towards therapy goals.   OBJECTIVE IMPAIRMENTS: Abnormal gait, decreased activity tolerance, decreased balance, decreased endurance, decreased knowledge of use of DME, decreased mobility, difficulty walking, decreased ROM, decreased strength, and pain.   ACTIVITY LIMITATIONS: carrying, lifting, bending, sitting, and squatting  PARTICIPATION LIMITATIONS: meal prep, cleaning,  laundry, shopping, community activity, occupation, and yard work  PERSONAL FACTORS: Age, Fitness, Past/current experiences, and Time since onset of injury/illness/exacerbation are also affecting patient's functional outcome.   REHAB POTENTIAL: Good  CLINICAL DECISION MAKING: Stable/uncomplicated  EVALUATION COMPLEXITY: Low   GOALS: Goals reviewed with patient? No  SHORT TERM GOALS: Target date: 07/29/23  Pt will be independent with HEP in order to demonstrate participation in Physical Therapy POC.  Baseline: Goal status: INITIAL  2.  Pt will report 5/10 pain at worst in last week with mobility in order to demonstrate improved pain with ADLs.  Baseline:  Goal status: INITIAL  LONG TERM GOALS: Target date: 08/19/23  Pt will improve 5TSTS by 2.3 seconds in order to demonstrate improved functional strength to return to desired activities.  Baseline: see objective.  Goal status: INITIAL  2.  Pt will improve 2 MWT by 40 feet in order to demonstrate improved functional ambulatory capacity in community  setting.  Baseline: see objective.  Goal status: INITIAL  3.  Pt will improve Modified Oswestry score by 6 points in order to demonstrate improved pain with functional goals and outcomes. Baseline: see objective.  Goal status: INITIAL  4.  Pt will report 3/10 pain at worst in last week with mobility in order to demonstrate reduced pain with ADLs lasting greater than 30 minutes.  Baseline: see objective.  Goal status: INITIAL   PLAN:  PT FREQUENCY: 1-2x/week  PT DURATION: 6 weeks  PLANNED INTERVENTIONS: 97110-Therapeutic exercises, 97530- Therapeutic activity, 97112- Neuromuscular re-education, 97535- Self Care, 40981- Manual therapy, [phone]- Gait training, Patient/Family education, Balance training, Stair training, Joint mobilization, Spinal mobilization, DME instructions, Cryotherapy, and Moist heat.  PLAN FOR NEXT SESSION: Assess hip AROM and OP, progress core and LE strengthening Armond Bertin, PT, DPT [facility] Office: [phone] 10:15 AM, 07/13/23   Managed Medicaid Authorization Request  Visit Dx Codes: M57.84 ; O96.295 ;  Z74.09   Functional Tool Score: Modified Oswestry 19/50   For all possible CPT codes, reference the Planned Interventions line above.     Check all conditions that are expected to impact treatment: {Conditions expected to impact treatment:Structural or anatomic abnormalities   If treatment provided at initial evaluation, no treatment charged due to lack of authorization.

## 2023-07-15 ENCOUNTER — Ambulatory Visit (HOSPITAL_COMMUNITY): Admitting: Physical Therapy

## 2023-07-15 DIAGNOSIS — M5459 Other low back pain: Secondary | ICD-10-CM | POA: Diagnosis not present

## 2023-07-15 DIAGNOSIS — R29898 Other symptoms and signs involving the musculoskeletal system: Secondary | ICD-10-CM

## 2023-07-15 DIAGNOSIS — Z7409 Other reduced mobility: Secondary | ICD-10-CM

## 2023-07-15 NOTE — Therapy (Signed)
 OUTPATIENT PHYSICAL THERAPY TREATMENT   Patient Name: Krystal Carroll MRN: [id number] DOB:2000-12-27, 23 y.o., female Today's Date: 07/15/2023  END OF SESSION:  PT End of Session - 07/15/23 1456     Visit Number 3    Number of Visits 12    Date for PT Re-Evaluation 08/20/23    Authorization Type [place] MEDICAID [employer]    Authorization Time Period no authorization needed    Progress Note Due on Visit 10    PT Start Time 1442    PT Stop Time 1520    PT Time Calculation (min) 38 min    Activity Tolerance Patient tolerated treatment well;Patient limited by pain    Behavior During Therapy [facility] for tasks assessed/performed            Past Medical History:  Diagnosis Date   Mild intermittent asthma    Seasonal allergies    Past Surgical History:  Procedure Laterality Date   ELBOW SURGERY Left    FRACTURE SURGERY     broken arm   Patient Active Problem List   Diagnosis Date Noted   Lumbar pain 05/20/2023   GERD (gastroesophageal reflux disease) 01/17/2020   Seasonal allergies 06/21/2019   Encounter for surveillance of contraceptive pills 06/04/2019   Insomnia 11/26/2017   Depression, recurrent (HCC) 07/24/2016   Moderate asthma     PCP: Zarwolo, Gloria, FNP  REFERRING PROVIDER: Zarwolo, Gloria, FNP  REFERRING DIAG: M54.50 (ICD-10-CM) - Lumbar pain  Rationale for Evaluation and Treatment: Rehabilitation  THERAPY DIAG:  Other low back pain  Weakness of both lower extremities  Impaired functional mobility and activity tolerance  ONSET DATE: Years  SUBJECTIVE:                                                                                                                                                                                           SUBJECTIVE STATEMENT: Pt with late arrival.  Reports compliance with HEP. No pain. Mostly only having pain in mornings and evenings.  Varied as high as 8/10.  Long work days and heavy lifting increases  pain.    Pt states that she has dealt with back pain since middle school. Vitamin D2 just added to medication list. Pt states she was working at [employer] and had to lift stuff over head, now pt works at [employer] and its better but has to stand for long periods. Pt mentions sciatica was a word her doctor used last appointment. Pain in left leg today but has gone down right leg before. Pt states sciatica pain started on a trip where she had to walk over 5 miles,  this is when she started complaining about the pain everyday. Significant other states pain is the worst in the morning, and after work at night. Pt asks about best sleeping position.  PERTINENT HISTORY:  -Broke arm twice, abnormal sensation in LUE  PAIN:  Are you having pain? No  PRECAUTIONS: None  RED FLAGS: None   WEIGHT BEARING RESTRICTIONS: No  FALLS:  Has patient fallen in last 6 months? No   OCCUPATION: [employer]  PLOF: Independent  PATIENT GOALS: Pt would like to be able to walk and stand longer, work with out pain, back to normal. Would like to return to   NEXT MD VISIT: July  OBJECTIVE:  Note: Objective measures were completed at Evaluation unless otherwise noted.  DIAGNOSTIC FINDINGS:  CLINICAL DATA:  Chronic low back pain for 2 years.   EXAM: LUMBAR SPINE - COMPLETE 4+ VIEW   COMPARISON:  None Available.   FINDINGS: There is no evidence of lumbar spine fracture. Mild scoliosis. Intervertebral disc spaces are maintained.   IMPRESSION: No acute fracture or dislocation. Mild scoliosis.  PATIENT SURVEYS:  Modified Oswestry 19/50      SENSATION: Pt states numbness in left UE and occasional LLE during sciatica flare ups.   POSTURE: rounded shoulders and forward head  PALPATION: Increased tenderness noted to palpation of L1-L5 lumbar segments although no tenderness noted in lumbar paraspinals  LUMBAR ROM:   AROM eval  Flexion 70  Extension 10, pain in mid/lower back  Right lateral flexion  25  Left lateral flexion 25  Right rotation Full ROM  Left rotation Full ROM    (Blank rows = not tested)  LOWER EXTREMITY MMT:    MMT Right eval Left eval  Hip flexion 4 3, pain  Hip extension 4 4  Hip abduction 3+ 3+  Hip adduction    Hip internal rotation    Hip external rotation    Knee flexion    Knee extension    Ankle dorsiflexion    Ankle plantarflexion    Ankle inversion    Ankle eversion     (Blank rows = not tested)  LUMBAR SPECIAL TESTS:  Straight leg raise test: Negative and Slump test: Negative  FUNCTIONAL TESTS:  5 times sit to stand: 17.02 2 minute walk test: 375 feet  GAIT: Distance walked: 375 feet Assistive device utilized: None Level of assistance: Complete Independence Comments: pt demonstrates decreased arm swing bilaterally, increased knee valgus and toe in compensation on occasion bilaterally, pt reports increased pain at end of but does not demonstrate asymmetrical gait.  TREATMENT DATE:  07/15/23 Supine:  LTR 10X  Bridge 10X2  SLR 10X2 each LE with stab  Physioball rectus abdominal 2X10  Physioball obliques 2X10 each    07/13/23 Supine: LTR x 10 x 5 Bridge 5 hold x 10 Hip abduction in hooklying with green theraband x 10 Hip adduction with 5 hold with bridge x 10 SLS 30 each leg no UE assist Seated scap retraction 5 hold x 10 Updated HEP     07/08/2023  Evaluation: -ROM measured, Strength assessed, HEP prescribed, pt educated on prognosis, findings, and importance of HEP compliance if given.  PATIENT EDUCATION:  Education details: Pt was educated on findings of PT evaluation, prognosis, frequency of therapy visits and rationale, attendance policy, and HEP if given.   Person educated: Patient Education method: Explanation, Verbal cues, and Handouts Education comprehension: verbalized  understanding, tactile cues required, and needs further education  HOME EXERCISE PROGRAM: Access Code: [email or web address] URL: [email or web address] Date: 07/13/2023  Exercises - Supine Bridge with Mini Swiss Ball Between Knees  - 2 x daily - 7 x weekly - 2 sets - 10 reps - 5 sec hold - Hooklying Clamshell with Resistance  - 2 x daily - 7 x weekly - 2 sets - 10 reps - Seated Scapular Retraction  - 2 x daily - 7 x weekly - 1 sets - 10 reps - 5 sec hold  Access Code: [email or web address] URL: [email or web address] Date: 07/08/2023 Prepared by: Armond Bertin  Exercises - Supine Bridge  - 1 x daily - 7 x weekly - 3 sets - 10 reps - Supine Lower Trunk Rotation  - 1 x daily - 7 x weekly - 3 sets - 10 reps - Sit to Stand Without Arm Support  - 1 x daily - 7 x weekly - 3 sets - 10 reps  ASSESSMENT:  CLINICAL IMPRESSION: Today's session with focus on core stability.  Began SLR with cues for abdominal bracing and isometric activity utilizing physio ball.  Pt required minima cues for form and hold times.  Pt able to keep count of repetitions and reports overall compliance.  No reports of pain during or at conclusion of session today.  Will need body mechanics training as reports lifting also aggravates pain. Pt will continue to benefit form skilled therapy services.      Eval: Patient is a 23 y.o. female who was seen today for physical therapy evaluation and treatment for M54.50 (ICD-10-CM) - Lumbar pain.  Patient demonstrates increased low back pain, decreased LE/core strength, abnormal gait pattern, and impaired balance. Patient also demonstrates difficulty with ambulation during today's session with decreased arm swing bilaterally, increased knee valgus and  toe in compensations, and decreased velocity noted. Patient also demonstrates LLE pain with extension and HEP prescription. Pt requires education on POC, role of PT, and best sleeping positions. Patient would benefit from skilled  physical therapy for increased endurance with ambulation, increased LE/core strength, and increased activity tolerance for improved gait quality, return to higher level of function with ADLs, and progress towards therapy goals.   OBJECTIVE IMPAIRMENTS: Abnormal gait, decreased activity tolerance, decreased balance, decreased endurance, decreased knowledge of use of DME, decreased mobility, difficulty walking, decreased ROM, decreased strength, and pain.   ACTIVITY LIMITATIONS: carrying, lifting, bending, sitting, and squatting  PARTICIPATION LIMITATIONS: meal prep, cleaning, laundry, shopping, community activity, occupation, and yard work  PERSONAL FACTORS: Age, Fitness, Past/current experiences, and Time since onset of injury/illness/exacerbation are also affecting patient's functional outcome.   REHAB POTENTIAL: Good  CLINICAL DECISION MAKING: Stable/uncomplicated  EVALUATION COMPLEXITY: Low   GOALS: Goals reviewed with patient? No  SHORT TERM GOALS: Target date: 07/29/23  Pt will be independent with HEP in order to demonstrate participation in Physical Therapy POC.  Baseline: Goal status: INITIAL  2.  Pt will report 5/10 pain at worst in last week with mobility in order to demonstrate improved pain with ADLs.  Baseline:  Goal status: INITIAL  LONG TERM GOALS: Target date: 08/19/23  Pt will improve 5TSTS by 2.3 seconds in order to demonstrate improved functional strength to return to desired activities.  Baseline: see objective.  Goal status: INITIAL  2.  Pt will improve 2 MWT by 40 feet in order to demonstrate improved functional ambulatory capacity in community setting.  Baseline: see objective.  Goal status: INITIAL  3.  Pt will improve Modified Oswestry score by 6 points in order to demonstrate improved pain with functional goals and outcomes. Baseline: see objective.  Goal status: INITIAL  4.  Pt will report 3/10 pain at worst in last week with mobility in order to  demonstrate reduced pain with ADLs lasting greater than 30 minutes.  Baseline: see objective.  Goal status: INITIAL   PLAN:  PT FREQUENCY: 1-2x/week  PT DURATION: 6 weeks  PLANNED INTERVENTIONS: 97110-Therapeutic exercises, 97530- Therapeutic activity, 97112- Neuromuscular re-education, 97535- Self Care, 16109- Manual therapy, [phone]- Gait training, Patient/Family education, Balance training, Stair training, Joint mobilization, Spinal mobilization, DME instructions, Cryotherapy, and Moist heat.  PLAN FOR NEXT SESSION: progress core and LE strengthening.  Progress to standing postural/core stab exercises; body mechanics.    Lorenso Romance, PTA/CLT [facility] Ph: [phone]  3:49 PM, 07/15/23

## 2023-07-20 ENCOUNTER — Encounter (HOSPITAL_COMMUNITY)

## 2023-07-22 ENCOUNTER — Ambulatory Visit (INDEPENDENT_AMBULATORY_CARE_PROVIDER_SITE_OTHER)

## 2023-07-22 ENCOUNTER — Encounter (HOSPITAL_COMMUNITY): Payer: Self-pay

## 2023-07-22 DIAGNOSIS — Z7409 Other reduced mobility: Secondary | ICD-10-CM

## 2023-07-22 DIAGNOSIS — M5459 Other low back pain: Secondary | ICD-10-CM | POA: Diagnosis not present

## 2023-07-22 DIAGNOSIS — R29898 Other symptoms and signs involving the musculoskeletal system: Secondary | ICD-10-CM

## 2023-07-22 NOTE — Therapy (Signed)
 OUTPATIENT PHYSICAL THERAPY TREATMENT   Patient Name: Krystal Carroll MRN: [id number] DOB:2000-12-15, 23 y.o., female Today's Date: 07/22/2023  END OF SESSION:  PT End of Session - 07/22/23 0851     Visit Number 4    Number of Visits 12    Date for PT Re-Evaluation 08/20/23    Authorization Type [place] MEDICAID [employer]    Authorization Time Period no authorization needed    Progress Note Due on Visit 10    PT Start Time [id number]   late sign in   PT Stop Time 0928    PT Time Calculation (min) 36 min    Activity Tolerance Patient tolerated treatment well;Patient limited by pain    Behavior During Therapy [facility] for tasks assessed/performed            Past Medical History:  Diagnosis Date   Mild intermittent asthma    Seasonal allergies    Past Surgical History:  Procedure Laterality Date   ELBOW SURGERY Left    FRACTURE SURGERY     broken arm   Patient Active Problem List   Diagnosis Date Noted   Lumbar pain 05/20/2023   GERD (gastroesophageal reflux disease) 01/17/2020   Seasonal allergies 06/21/2019   Encounter for surveillance of contraceptive pills 06/04/2019   Insomnia 11/26/2017   Depression, recurrent (HCC) 07/24/2016   Moderate asthma     PCP: Zarwolo, Gloria, FNP  REFERRING PROVIDER: Zarwolo, Gloria, FNP  REFERRING DIAG: M54.50 (ICD-10-CM) - Lumbar pain  Rationale for Evaluation and Treatment: Rehabilitation  THERAPY DIAG:  Other low back pain  Weakness of both lower extremities  Impaired functional mobility and activity tolerance  ONSET DATE: Years  SUBJECTIVE:                                                                                                                                                                                           SUBJECTIVE STATEMENT: Late sign in.  LBP pain scale 1/10 soreness in center of back.  Increased pain while standing long days of work.  Pain worse in mornings and at night.  Exercises going  well at home.   Pt states that she has dealt with back pain since middle school. Vitamin D2 just added to medication list. Pt states she was working at [employer] and had to lift stuff over head, now pt works at [employer] and its better but has to stand for long periods. Pt mentions sciatica was a word her doctor used last appointment. Pain in left leg today but has gone down right leg before. Pt states sciatica pain started on a trip where she had  to walk over 5 miles, this is when she started complaining about the pain everyday. Significant other states pain is the worst in the morning, and after work at night. Pt asks about best sleeping position.  PERTINENT HISTORY:  -Broke arm twice, abnormal sensation in LUE  PAIN:  Are you having pain? No  PRECAUTIONS: None  RED FLAGS: None   WEIGHT BEARING RESTRICTIONS: No  FALLS:  Has patient fallen in last 6 months? No   OCCUPATION: [employer]  PLOF: Independent  PATIENT GOALS: Pt would like to be able to walk and stand longer, work with out pain, back to normal. Would like to return to   NEXT MD VISIT: July  OBJECTIVE:  Note: Objective measures were completed at Evaluation unless otherwise noted.  DIAGNOSTIC FINDINGS:  CLINICAL DATA:  Chronic low back pain for 2 years.   EXAM: LUMBAR SPINE - COMPLETE 4+ VIEW   COMPARISON:  None Available.   FINDINGS: There is no evidence of lumbar spine fracture. Mild scoliosis. Intervertebral disc spaces are maintained.   IMPRESSION: No acute fracture or dislocation. Mild scoliosis.  PATIENT SURVEYS:  Modified Oswestry 19/50      SENSATION: Pt states numbness in left UE and occasional LLE during sciatica flare ups.   POSTURE: rounded shoulders and forward head  PALPATION: Increased tenderness noted to palpation of L1-L5 lumbar segments although no tenderness noted in lumbar paraspinals  LUMBAR ROM:   AROM eval  Flexion 70  Extension 10, pain in mid/lower back  Right lateral  flexion 25  Left lateral flexion 25  Right rotation Full ROM  Left rotation Full ROM    (Blank rows = not tested)  LOWER EXTREMITY MMT:    MMT Right eval Left eval  Hip flexion 4 3, pain  Hip extension 4 4  Hip abduction 3+ 3+  Hip adduction    Hip internal rotation    Hip external rotation    Knee flexion    Knee extension    Ankle dorsiflexion    Ankle plantarflexion    Ankle inversion    Ankle eversion     (Blank rows = not tested)  LUMBAR SPECIAL TESTS:  Straight leg raise test: Negative and Slump test: Negative  FUNCTIONAL TESTS:  5 times sit to stand: 17.02 2 minute walk test: 375 feet  GAIT: Distance walked: 375 feet Assistive device utilized: None Level of assistance: Complete Independence Comments: pt demonstrates decreased arm swing bilaterally, increased knee valgus and toe in compensation on occasion bilaterally, pt reports increased pain at end of but does not demonstrate asymmetrical gait.  TREATMENT DATE:  07/21/23: Sidelying:  Clam with GTB 10x 5 (increased fatigue Lt vs Rt) Supine:  Bridge with GTB around thigh 15x 5  Physioball rectus abdominal 10x 5  Physioball obliques 10x 5 Seated:  Postural awareness  Educated lumbar support to assist with posture. Standing:   RTB shoulder extension 10x 5  RTB rows 10x 5  07/15/23 Supine:  LTR 10X  Bridge 10X2  SLR 10X2 each LE with stab  Physioball rectus abdominal 2X10  Physioball obliques 2X10 each      PATIENT EDUCATION:  Education details: Pt was educated on findings of PT evaluation, prognosis, frequency of therapy visits and rationale, attendance policy, and HEP if given.   Person educated: Patient Education method: Explanation, Verbal cues, and Handouts Education comprehension: verbalized understanding, tactile cues required, and needs further education  HOME EXERCISE PROGRAM: Access Code: [email or web address] URL: [email or web address] Date: 07/13/2023  Exercises -  Supine Bridge with Mini Swiss Ball Between Knees  - 2 x daily - 7 x weekly - 2 sets - 10 reps - 5 sec hold - Hooklying Clamshell with Resistance  - 2 x daily - 7 x weekly - 2 sets - 10 reps - Seated Scapular Retraction  - 2 x daily - 7 x weekly - 1 sets - 10 reps - 5 sec hold  Access Code: [email or web address] URL: [email or web address] Date: 07/08/2023 Prepared by: Lang Ada  Exercises - Supine Bridge  - 1 x daily - 7 x weekly - 3 sets - 10 reps - Supine Lower Trunk Rotation  - 1 x daily - 7 x weekly - 3 sets - 10 reps - Sit to Stand Without Arm Support  - 1 x daily - 7 x weekly - 3 sets - 10 reps  ASSESSMENT:  CLINICAL IMPRESSION: Pt presents forward head posture.  Educated importance of posture for pain control.  Continues with core stability exercises with additional gluteal and postural strengthening exercises added.  Educated benefits with lumbar support to assist with seated posture.  No reports of increased pain through session.   Eval: Patient is a 23 y.o. female who was seen today for physical therapy evaluation and treatment for M54.50 (ICD-10-CM) - Lumbar pain.  Patient demonstrates increased low back pain, decreased LE/core strength, abnormal gait pattern, and impaired balance. Patient also demonstrates difficulty with ambulation during today's session with decreased arm swing bilaterally, increased knee valgus and  toe in compensations, and decreased velocity noted. Patient also demonstrates LLE pain with extension and HEP prescription. Pt requires education on POC, role of PT, and best sleeping positions. Patient would benefit from skilled physical therapy for increased endurance with ambulation, increased LE/core strength, and increased activity tolerance for improved gait quality, return to higher level of function with ADLs, and progress towards therapy goals.   OBJECTIVE IMPAIRMENTS: Abnormal gait, decreased activity tolerance, decreased balance, decreased endurance,  decreased knowledge of use of DME, decreased mobility, difficulty walking, decreased ROM, decreased strength, and pain.   ACTIVITY LIMITATIONS: carrying, lifting, bending, sitting, and squatting  PARTICIPATION LIMITATIONS: meal prep, cleaning, laundry, shopping, community activity, occupation, and yard work  PERSONAL FACTORS: Age, Fitness, Past/current experiences, and Time since onset of injury/illness/exacerbation are also affecting patient's functional outcome.   REHAB POTENTIAL: Good  CLINICAL DECISION MAKING: Stable/uncomplicated  EVALUATION COMPLEXITY: Low   GOALS: Goals reviewed with patient? No  SHORT TERM GOALS: Target date: 07/29/23  Pt will be independent with HEP in order to demonstrate participation in Physical Therapy POC.  Baseline: Goal status: INITIAL  2.  Pt will report 5/10 pain at worst in last week with mobility in order to demonstrate improved pain with ADLs.  Baseline:  Goal status: INITIAL  LONG TERM GOALS: Target date: 08/19/23  Pt will improve 5TSTS by 2.3 seconds in order to demonstrate improved functional strength to return to desired activities.  Baseline: see objective.  Goal status: INITIAL  2.  Pt will improve 2 MWT by 40 feet in order to demonstrate improved functional ambulatory capacity in community setting.  Baseline: see objective.  Goal status: INITIAL  3.  Pt will improve Modified Oswestry score by 6 points in order to demonstrate improved pain with functional goals and outcomes. Baseline: see objective.  Goal status: INITIAL  4.  Pt will report 3/10 pain at worst in last week with mobility in order to demonstrate reduced pain with ADLs lasting greater than 30 minutes.  Baseline: see objective.  Goal status: INITIAL   PLAN:  PT FREQUENCY: 1-2x/week  PT DURATION: 6 weeks  PLANNED INTERVENTIONS: 97110-Therapeutic exercises, 97530- Therapeutic activity, 97112- Neuromuscular re-education, 97535- Self Care, [id number]- Manual therapy,  [phone]- Gait training, Patient/Family education, Balance training, Stair training, Joint mobilization, Spinal mobilization, DME instructions, Cryotherapy, and Moist heat.  PLAN FOR NEXT SESSION: progress core and LE strengthening.  Progress to standing postural/core stab exercises; body mechanics. Continue theraband postural strengthening and give to pt once good form.  Added squats next session.    Augustin Mclean, LPTA/CLT; CBIS [phone]  9:42 AM, 07/22/23

## 2023-07-27 ENCOUNTER — Encounter (HOSPITAL_COMMUNITY): Payer: Self-pay

## 2023-07-27 ENCOUNTER — Ambulatory Visit (HOSPITAL_COMMUNITY): Attending: Family Medicine

## 2023-07-27 DIAGNOSIS — Z7409 Other reduced mobility: Secondary | ICD-10-CM | POA: Diagnosis present

## 2023-07-27 DIAGNOSIS — R29898 Other symptoms and signs involving the musculoskeletal system: Secondary | ICD-10-CM | POA: Diagnosis present

## 2023-07-27 DIAGNOSIS — M5459 Other low back pain: Secondary | ICD-10-CM | POA: Diagnosis present

## 2023-07-27 NOTE — Therapy (Signed)
 OUTPATIENT PHYSICAL THERAPY TREATMENT   Patient Name: Krystal Carroll MRN: [id number] DOB:Aug 01, 2000, 23 y.o., female Today's Date: 07/27/2023  END OF SESSION:  PT End of Session - 07/27/23 0908     Visit Number 5    Number of Visits 12    Date for PT Re-Evaluation 08/20/23    Authorization Type [place] [employer]ICA[employer]    Authorization Time Period no authorization needed    Progress Note Due on Visit 10    PT Start Time 0859    PT Stop Time 0929    PT Time Calculation (min) 30 min    Activity Tolerance Patient tolerated treatment well;Patient limited by pain    Behavior During Therapy [facility] for tasks assessed/performed             Past Medical History:  Diagnosis Date   Mild intermittent asthma    Seasonal allergies    Past Surgical History:  Procedure Laterality Date   ELBOW SURGERY Left    FRACTURE SURGERY     broken arm   Patient Active Problem List   Diagnosis Date Noted   Lumbar pain 05/20/2023   GERD (gastroesophageal reflux disease) 01/17/2020   Seasonal allergies 06/21/2019   Encounter for surveillance of contraceptive pills 06/04/2019   Insomnia 11/26/2017   Depression, recurrent (HCC) 07/24/2016   Moderate asthma     PCP: Zarwolo, Gloria, FNP  REFERRING PROVIDER: Zarwolo, Gloria, FNP  REFERRING DIAG: M54.50 (ICD-10-CM) - Lumbar pain  Rationale for Evaluation and Treatment: Rehabilitation  THERAPY DIAG:  Other low back pain  Weakness of both lower extremities  Impaired functional mobility and activity tolerance  ONSET DATE: Years  SUBJECTIVE:                                                                                                                                                                                           SUBJECTIVE STATEMENT: Late sign in.  1/10 pain today.   Pt states that she has dealt with back pain since middle school. Vitamin D2 just added to medication list. Pt states she was working at [employer] and had to lift stuff over head, now pt works at [employer] and its better but has to stand for long periods. Pt mentions sciatica was a word her doctor used last appointment. Pain in left leg today but has gone down right leg before. Pt states sciatica pain started on a trip where she had to walk over 5 miles, this is when she started complaining about the pain everyday. Significant other states pain is the worst in the morning, and after work at night. Pt asks  about best sleeping position.  PERTINENT HISTORY:  -Broke arm twice, abnormal sensation in LUE  PAIN:  Are you having pain? No  PRECAUTIONS: None  RED FLAGS: None   WEIGHT BEARING RESTRICTIONS: No  FALLS:  Has patient fallen in last 6 months? No   OCCUPATION: [employer]  PLOF: Independent  PATIENT GOALS: Pt would like to be able to walk and stand longer, work with out pain, back to normal. Would like to return to   NEXT MD VISIT: July  OBJECTIVE:  Note: Objective measures were completed at Evaluation unless otherwise noted.  DIAGNOSTIC FINDINGS:  CLINICAL DATA:  Chronic low back pain for 2 years.   EXAM: LUMBAR SPINE - COMPLETE 4+ VIEW   COMPARISON:  None Available.   FINDINGS: There is no evidence of lumbar spine fracture. Mild scoliosis. Intervertebral disc spaces are maintained.   IMPRESSION: No acute fracture or dislocation. Mild scoliosis.  PATIENT SURVEYS:  Modified Oswestry 19/50      SENSATION: Pt states numbness in left UE and occasional LLE during sciatica flare ups.   POSTURE: rounded shoulders and forward head  PALPATION: Increased tenderness noted to palpation of L1-L5 lumbar segments although no tenderness noted in lumbar paraspinals  LUMBAR ROM:   AROM eval  Flexion 70  Extension 10, pain in mid/lower back  Right lateral flexion 25  Left lateral flexion 25  Right rotation Full ROM  Left rotation Full ROM    (Blank rows = not tested)  LOWER EXTREMITY MMT:    MMT Right eval  Left eval  Hip flexion 4 3, pain  Hip extension 4 4  Hip abduction 3+ 3+  Hip adduction    Hip internal rotation    Hip external rotation    Knee flexion    Knee extension    Ankle dorsiflexion    Ankle plantarflexion    Ankle inversion    Ankle eversion     (Blank rows = not tested)  LUMBAR SPECIAL TESTS:  Straight leg raise test: Negative and Slump test: Negative  FUNCTIONAL TESTS:  5 times sit to stand: 17.02 2 minute walk test: 375 feet  GAIT: Distance walked: 375 feet Assistive device utilized: None Level of assistance: Complete Independence Comments: pt demonstrates decreased arm swing bilaterally, increased knee valgus and toe in compensation on occasion bilaterally, pt reports increased pain at end of but does not demonstrate asymmetrical gait.  TREATMENT DATE:  07/27/2023  -Curl up 10x10'' -Side planks 5x10'' bilaterally -Bird dog x 7 bilaterally -Standing shoulder extension with RTB x 12 -hip hinge pattern education-breaking down squatting mechanics  07/21/23: Sidelying:  Clam with GTB 10x 5 (increased fatigue Lt vs Rt) Supine:  Bridge with GTB around thigh 15x 5  Physioball rectus abdominal 10x 5  Physioball obliques 10x 5 Seated:  Postural awareness  Educated lumbar support to assist with posture. Standing:   RTB shoulder extension 10x 5  RTB rows 10x 5  07/15/23 Supine:  LTR 10X  Bridge 10X2  SLR 10X2 each LE with stab  Physioball rectus abdominal 2X10  Physioball obliques 2X10 each      PATIENT EDUCATION:  Education details: Pt was educated on findings of PT evaluation, prognosis, frequency of therapy visits and rationale, attendance policy, and HEP if given.   Person educated: Patient Education method: Explanation, Verbal cues, and Handouts Education comprehension: verbalized understanding, tactile cues required, and needs further education  HOME EXERCISE PROGRAM: Access Code: [email or web address] URL:  [email or web address] Date: 07/13/2023  Exercises - Supine Bridge  with Mini Swiss Ball Between Knees  - 2 x daily - 7 x weekly - 2 sets - 10 reps - 5 sec hold - Hooklying Clamshell with Resistance  - 2 x daily - 7 x weekly - 2 sets - 10 reps - Seated Scapular Retraction  - 2 x daily - 7 x weekly - 1 sets - 10 reps - 5 sec hold  Access Code: [email or web address] URL: [email or web address] Date: 07/08/2023 Prepared by: Lang Ada  Exercises - Supine Bridge  - 1 x daily - 7 x weekly - 3 sets - 10 reps - Supine Lower Trunk Rotation  - 1 x daily - 7 x weekly - 3 sets - 10 reps - Sit to Stand Without Arm Support  - 1 x daily - 7 x weekly - 3 sets - 10 reps  ASSESSMENT:  CLINICAL IMPRESSION: Pt tolerating therapy session well. Provided core strengthening interventions with standing activities as well. Pt with improved posture for standing core intervention with RTB, pt's form broke with GTB, continued muscle weakness. Included carrying activity for more oblique activation. Poor proprioceptive awareness of hips and LB. Pt will benefit from skilled Physical Therapy services to address deficits/limitations in order to improve functional and QOL.    Eval: Patient is a 23 y.o. female who was seen today for physical therapy evaluation and treatment for M54.50 (ICD-10-CM) - Lumbar pain.  Patient demonstrates increased low back pain, decreased LE/core strength, abnormal gait pattern, and impaired balance. Patient also demonstrates difficulty with ambulation during today's session with decreased arm swing bilaterally, increased knee valgus and  toe in compensations, and decreased velocity noted. Patient also demonstrates LLE pain with extension and HEP prescription. Pt requires education on POC, role of PT, and best sleeping positions. Patient would benefit from skilled physical therapy for increased endurance with ambulation, increased LE/core strength, and increased activity tolerance  for improved gait quality, return to higher level of function with ADLs, and progress towards therapy goals.   OBJECTIVE IMPAIRMENTS: Abnormal gait, decreased activity tolerance, decreased balance, decreased endurance, decreased knowledge of use of DME, decreased mobility, difficulty walking, decreased ROM, decreased strength, and pain.   ACTIVITY LIMITATIONS: carrying, lifting, bending, sitting, and squatting  PARTICIPATION LIMITATIONS: meal prep, cleaning, laundry, shopping, community activity, occupation, and yard work  PERSONAL FACTORS: Age, Fitness, Past/current experiences, and Time since onset of injury/illness/exacerbation are also affecting patient's functional outcome.   REHAB POTENTIAL: Good  CLINICAL DECISION MAKING: Stable/uncomplicated  EVALUATION COMPLEXITY: Low   GOALS: Goals reviewed with patient? No  SHORT TERM GOALS: Target date: 07/29/23  Pt will be independent with HEP in order to demonstrate participation in Physical Therapy POC.  Baseline: Goal status: INITIAL  2.  Pt will report 5/10 pain at worst in last week with mobility in order to demonstrate improved pain with ADLs.  Baseline:  Goal status: INITIAL  LONG TERM GOALS: Target date: 08/19/23  Pt will improve 5TSTS by 2.3 seconds in order to demonstrate improved functional strength to return to desired activities.  Baseline: see objective.  Goal status: INITIAL  2.  Pt will improve 2 MWT by 40 feet in order to demonstrate improved functional ambulatory capacity in community setting.  Baseline: see objective.  Goal status: INITIAL  3.  Pt will improve Modified Oswestry score by 6 points in order to demonstrate improved pain with functional goals and outcomes. Baseline: see objective.  Goal status: INITIAL  4.  Pt will report 3/10 pain at worst in last week with mobility in  order to demonstrate reduced pain with ADLs lasting greater than 30 minutes.  Baseline: see objective.  Goal status:  INITIAL   PLAN:  PT FREQUENCY: 1-2x/week  PT DURATION: 6 weeks  PLANNED INTERVENTIONS: 97110-Therapeutic exercises, 97530- Therapeutic activity, 97112- Neuromuscular re-education, 97535- Self Care, [id number]- Manual therapy, [phone]- Gait training, Patient/Family education, Balance training, Stair training, Joint mobilization, Spinal mobilization, DME instructions, Cryotherapy, and Moist heat.  PLAN FOR NEXT SESSION: progress core and LE strengthening.  Progress to standing postural/core stab exercises; body mechanics. Continue theraband postural strengthening and give to pt once good form.  Added squats next session.    Omega JONETTA Bottcher PT, DPT [facility] [place] [phone] office  9:30 AM, 07/27/23

## 2023-07-29 ENCOUNTER — Encounter: Payer: Self-pay | Admitting: Family Medicine

## 2023-07-29 ENCOUNTER — Ambulatory Visit (INDEPENDENT_AMBULATORY_CARE_PROVIDER_SITE_OTHER): Admitting: Family Medicine

## 2023-07-29 ENCOUNTER — Other Ambulatory Visit (HOSPITAL_COMMUNITY)
Admission: RE | Admit: 2023-07-29 | Discharge: 2023-07-29 | Disposition: A | Discharge status: Home / Self Care | Source: Ambulatory Visit | Attending: Family Medicine | Admitting: Family Medicine

## 2023-07-29 VITALS — BP 111/73 | HR 69 | Ht 60.0 in | Wt 119.1 lb

## 2023-07-29 DIAGNOSIS — J4520 Mild intermittent asthma, uncomplicated: Secondary | ICD-10-CM | POA: Diagnosis not present

## 2023-07-29 DIAGNOSIS — Z124 Encounter for screening for malignant neoplasm of cervix: Secondary | ICD-10-CM

## 2023-07-29 DIAGNOSIS — Z3041 Encounter for surveillance of contraceptive pills: Secondary | ICD-10-CM | POA: Diagnosis not present

## 2023-07-29 MED ORDER — ALBUTEROL SULFATE HFA 108 (90 BASE) MCG/ACT IN AERS: 1.0000 | INHALATION_SPRAY | Freq: Four times a day (QID) | RESPIRATORY_TRACT | 2 refills | Status: AC | PRN

## 2023-07-29 MED ORDER — FLUTICASONE-SALMETEROL 100-50 MCG/ACT IN AEPB
1.0000 | INHALATION_SPRAY | Freq: Two times a day (BID) | RESPIRATORY_TRACT | 3 refills | Status: AC
Start: 2023-07-29 — End: ?

## 2023-07-29 MED ORDER — NORGESTIM-ETH ESTRAD TRIPHASIC 0.18/0.215/0.25 MG-35 MCG PO TABS: 1.0000 | ORAL_TABLET | Freq: Every day | ORAL | 0 refills | Status: DC

## 2023-07-29 NOTE — Assessment & Plan Note (Addendum)
 Negative urine pregnancy test Will refill TRI-ESTARYLLA Warning Signs (ACHES): Abdominal pain (severe) Chest pain or shortness of breath Headache (severe, unilateral, dizziness) Eye problems (blurred vision, blind spots) Severe leg pain (calf or thigh)  Instructions for Use: Quick Start: If not pregnant, take first pill today; use backup method for 7 days if >5 days since last period. First Day Start: Take first pill on first day of menses; no backup needed. Take pill at the same time daily; with food or at bedtime if nausea occurs. Use condoms for STI/HIV protection. Use backup method if vomiting or diarrhea occurs.  Missed Pills: 1 pill late (<48 hrs): Take as soon as remembered; no backup needed. 2+ pills missed (>48 hrs): Take most recent missed pill ASAP, discard others. Continue regular schedule and use backup contraception for 7 days. Missed pills in last week (days 15-21): Start new pack immediately; no hormone-free interval. Use backup if unable to start new pack right away.  Emergency Contraception: Consider if pills were missed in the first week and unprotected sex occurred in the past 5 days.  Common Side Effects: Nausea, breast tenderness, increased BP, fatigue, mood changes, VTE risk, and cyclic weight gain.

## 2023-07-29 NOTE — Progress Notes (Signed)
 Established Patient Office Visit  Subjective:  Patient ID: Krystal Carroll, female    DOB: [birth year]  Age: 23 y.o. MRN: [id number]  CC:  Chief Complaint  Patient presents with   Medical Management of Chronic Issues    Pt here for a pap smear    HPI Krystal Carroll is a 23 y.o. female presents for  a pap smear examination.  For the details of today's visit, please refer to the assessment and plan.    Past Medical History:  Diagnosis Date   Mild intermittent asthma    Seasonal allergies     Past Surgical History:  Procedure Laterality Date   ELBOW SURGERY Left    FRACTURE SURGERY     broken arm    Family History  Problem Relation Age of Onset   Diabetes Paternal Grandmother    Multiple sclerosis Paternal Grandmother    Heart disease Neg Hx    Stroke Neg Hx     Social History   Socioeconomic History   Marital status: Single    Spouse name: Not on file   Number of children: Not on file   Years of education: Not on file   Highest education level: Not on file  Occupational History   Not on file  Tobacco Use   Smoking status: Never    Passive exposure: Yes   Smokeless tobacco: Never  Vaping Use   Vaping status: Never Used  Substance and Sexual Activity   Alcohol use: Yes    Comment: occasional   Drug use: Yes    Types: Marijuana   Sexual activity: Yes  Other Topics Concern   Not on file  Social History Narrative   Not on file   Social Drivers of Health   Financial Resource Strain: Not on file  Food Insecurity: No Food Insecurity (05/20/2023)   Hunger Vital Sign    Worried About Running Out of Food in the Last Year: Never true    Ran Out of Food in the Last Year: Never true  Transportation Needs: Unmet Transportation Needs (05/20/2023)   PRAPARE - Transportation    Lack of Transportation (Medical): Yes    Lack of Transportation (Non-Medical): Yes  Physical Activity: Inactive (05/20/2023)   Exercise Vital Sign    Days of Exercise per Week: 0 days     Minutes of Exercise per Session: 0 min  Stress: Not on file  Social Connections: Not on file  Intimate Partner Violence: Not At Risk (05/20/2023)   Humiliation, Afraid, Rape, and Kick questionnaire    Fear of Current or Ex-Partner: No    Emotionally Abused: No    Physically Abused: No    Sexually Abused: No    Outpatient Medications Prior to Visit  Medication Sig Dispense Refill   Vitamin D , Ergocalciferol , (DRISDOL ) 1.25 MG (50000 UNIT) CAPS capsule Take 1 capsule (50,000 Units total) by mouth every 7 (seven) days. 20 capsule 1   albuterol  (PROVENTIL  HFA;VENTOLIN  HFA) 108 (90 Base) MCG/ACT inhaler Inhale 1-2 puffs into the lungs every 6 (six) hours as needed for wheezing or shortness of breath. (Patient not taking: Reported on 05/20/2023) 1 Inhaler 0   Fluticasone -Salmeterol (ADVAIR) 100-50 MCG/DOSE AEPB Inhale 1 puff into the lungs 2 (two) times daily. (Patient not taking: Reported on 05/20/2023) 1 each 5   omeprazole  (PRILOSEC) 20 MG capsule Take 1 capsule (20 mg total) by mouth daily. (Patient not taking: Reported on 05/20/2023) 30 capsule 3   ondansetron  (ZOFRAN  ODT) 4 MG disintegrating  tablet Take 1 tablet (4 mg total) by mouth every 8 (eight) hours as needed for nausea or vomiting. (Patient not taking: Reported on 05/20/2023) 20 tablet 1   polyethylene glycol powder (GLYCOLAX /MIRALAX ) 17 GM/SCOOP powder Mix one 17gram scoop in one 8oz glass of water. Drink this daily/ (Patient not taking: Reported on 05/20/2023) 578 g 1   sucralfate  (CARAFATE ) 1 g tablet Take 1 tablet (1 g total) by mouth 4 (four) times daily -  with meals and at bedtime. (Patient not taking: Reported on 05/20/2023) 120 tablet 1   venlafaxine  XR (EFFEXOR -XR) 37.5 MG 24 hr capsule TAKE 1 CAPSULE BY MOUTH DAILY WITH BREAKFAST. (Patient not taking: Reported on 05/20/2023) 10 capsule 0   TRI-ESTARYLLA 0.18/0.215/0.25 MG-35 MCG tablet TAKE 1 TABLET BY MOUTH EVERY DAY (Patient not taking: Reported on 05/20/2023) 28 tablet 0   No  facility-administered medications prior to visit.    Allergies  Allergen Reactions   Claritin [Loratadine] Rash    ROS Review of Systems  Constitutional:  Negative for chills and fever.  Eyes:  Negative for visual disturbance.  Respiratory:  Negative for chest tightness and shortness of breath.   Neurological:  Negative for dizziness and headaches.      Objective:    Physical Exam HENT:     Head: Normocephalic.     Mouth/Throat:     Mouth: Mucous membranes are moist.  Cardiovascular:     Rate and Rhythm: Normal rate.     Heart sounds: Normal heart sounds.  Pulmonary:     Effort: Pulmonary effort is normal.     Breath sounds: Normal breath sounds.  Genitourinary:    Exam position: Lithotomy position.     Tanner stage (genital): 5.     Comments: Vaginal wall: pink and rugated, smooth and non-tender; absence of lesions, edema, and erythema. Labia Majora and Minora: present bilaterally, moist, soft tissue, and homogeneous; free of edema and ulcerations. Clitoris is anatomically present, above the urethral, and free of lesions, masses, and ulceration.   Neurological:     Mental Status: She is alert.     BP 111/73   Pulse 69   Ht 5' (1.524 m)   Wt 119 lb 1.9 oz (54 kg)   SpO2 99%   BMI 23.26 kg/m  Wt Readings from Last 3 Encounters:  07/29/23 119 lb 1.9 oz (54 kg)  05/20/23 112 lb 1.9 oz (50.9 kg)  01/17/20 145 lb 6.4 oz (66 kg) (79%, Z= 0.82)*   * Growth percentiles are based on CDC (Girls, 2-20 Years) data.    Lab Results  Component Value Date   TSH 3.610 05/20/2023   Lab Results  Component Value Date   WBC 7.0 05/20/2023   HGB 12.8 05/20/2023   HCT 38.9 05/20/2023   MCV 94 05/20/2023   PLT 244 05/20/2023   Lab Results  Component Value Date   NA 140 05/20/2023   K 4.7 05/20/2023   CO2 24 05/20/2023   GLUCOSE 80 05/20/2023   BUN 12 05/20/2023   CREATININE 0.73 05/20/2023   BILITOT 1.7 (H) 05/20/2023   ALKPHOS 93 05/20/2023   AST 16 05/20/2023    ALT 17 05/20/2023   PROT 7.3 05/20/2023   ALBUMIN 4.5 05/20/2023   CALCIUM 9.5 05/20/2023   ANIONGAP 3 (L) 05/18/2013   EGFR 120 05/20/2023   Lab Results  Component Value Date   CHOL 147 05/20/2023   Lab Results  Component Value Date   HDL 52 05/20/2023   Lab  Results  Component Value Date   LDLCALC 83 05/20/2023   Lab Results  Component Value Date   TRIG 59 05/20/2023   Lab Results  Component Value Date   CHOLHDL 2.8 05/20/2023   Lab Results  Component Value Date   HGBA1C 5.2 05/20/2023      Assessment & Plan:  Cervical cancer screening Assessment & Plan: -Cytology and HPV co-testing (preferred) every 5 years or cytology alone (acceptable) every 3 years. - Pap due 2028  Orders: -     Cytology - PAP  Uses oral contraception Assessment & Plan: Negative urine pregnancy test Will refill TRI-ESTARYLLA Warning Signs (ACHES): Abdominal pain (severe) Chest pain or shortness of breath Headache (severe, unilateral, dizziness) Eye problems (blurred vision, blind spots) Severe leg pain (calf or thigh)  Instructions for Use: Quick Start: If not pregnant, take first pill today; use backup method for 7 days if >5 days since last period. First Day Start: Take first pill on first day of menses; no backup needed. Take pill at the same time daily; with food or at bedtime if nausea occurs. Use condoms for STI/HIV protection. Use backup method if vomiting or diarrhea occurs.  Missed Pills: 1 pill late (<48 hrs): Take as soon as remembered; no backup needed. 2+ pills missed (>48 hrs): Take most recent missed pill ASAP, discard others. Continue regular schedule and use backup contraception for 7 days. Missed pills in last week (days 15-21): Start new pack immediately; no hormone-free interval. Use backup if unable to start new pack right away.  Emergency Contraception: Consider if pills were missed in the first week and unprotected sex occurred in the past 5 days.  Common  Side Effects: Nausea, breast tenderness, increased BP, fatigue, mood changes, VTE risk, and cyclic weight gain.   Orders: -     Norgestim-Eth Estrad Triphasic; Take 1 tablet by mouth daily.  Dispense: 84 tablet; Refill: 0 -     Pregnancy, urine  Mild intermittent asthma without complication -     Fluticasone -Salmeterol; Inhale 1 puff into the lungs 2 (two) times daily.  Dispense: 1 each; Refill: 3 -     Albuterol  Sulfate HFA; Inhale 1-2 puffs into the lungs every 6 (six) hours as needed for wheezing or shortness of breath.  Dispense: 8 g; Refill: 2  Note: This chart has been completed using Engineer, civil (consulting) software, and while attempts have been made to ensure accuracy, certain words and phrases may not be transcribed as intended.    Follow-up: Return in about 6 months (around 01/29/2024).   Roger Kettles, FNP

## 2023-07-29 NOTE — Assessment & Plan Note (Addendum)
-  Cytology and HPV co-testing (preferred) every 5 years or cytology alone (acceptable) every 3 years. - Pap due 2028

## 2023-07-29 NOTE — Patient Instructions (Addendum)
 I appreciate the opportunity to provide care to you today!    Follow up: 6 months  You will be informed of your Pap smear results via MyChart once they are available.   Attached with your AVS, you will find valuable resources for self-education. I highly recommend dedicating some time to thoroughly examine them.   Please continue to a heart-healthy diet and increase your physical activities. Try to exercise for at least five days a week.    It was a pleasure to see you and I look forward to continuing to work together on your health and well-being. Please do not hesitate to call the office if you need care or have questions about your care.  In case of emergency, please visit the Emergency Department for urgent care, or contact our clinic at [phone] to schedule an appointment. We're here to help you!   Have a wonderful day and week. With Gratitude, Eleisha Branscomb MSN, FNP-BC

## 2023-08-02 LAB — PREGNANCY, URINE: Preg Test, Ur: NEGATIVE

## 2023-08-04 ENCOUNTER — Ambulatory Visit (HOSPITAL_COMMUNITY)

## 2023-08-04 DIAGNOSIS — R29898 Other symptoms and signs involving the musculoskeletal system: Secondary | ICD-10-CM

## 2023-08-04 DIAGNOSIS — M5459 Other low back pain: Secondary | ICD-10-CM | POA: Diagnosis not present

## 2023-08-04 DIAGNOSIS — Z7409 Other reduced mobility: Secondary | ICD-10-CM

## 2023-08-04 NOTE — Therapy (Signed)
 OUTPATIENT PHYSICAL THERAPY TREATMENT   Patient Name: Krystal Carroll MRN: [id number] DOB:March 24, 2000, 23 y.o., female Today's Date: 08/04/2023  END OF SESSION:  PT End of Session - 08/04/23 1027     Visit Number 6    Number of Visits 12    Date for PT Re-Evaluation 08/20/23    Authorization Type [place] [employer]ICA[employer]    Authorization Time Period no authorization needed    Progress Note Due on Visit 10    PT Start Time 1022    PT Stop Time 1101    PT Time Calculation (min) 39 min    Activity Tolerance Patient tolerated treatment well;Patient limited by pain    Behavior During Therapy [facility] for tasks assessed/performed              Past Medical History:  Diagnosis Date   Mild intermittent asthma    Seasonal allergies    Past Surgical History:  Procedure Laterality Date   ELBOW SURGERY Left    FRACTURE SURGERY     broken arm   Patient Active Problem List   Diagnosis Date Noted   Cervical cancer screening 07/29/2023   Uses oral contraception 07/29/2023   Lumbar pain 05/20/2023   GERD (gastroesophageal reflux disease) 01/17/2020   Seasonal allergies 06/21/2019   Encounter for surveillance of contraceptive pills 06/04/2019   Insomnia 11/26/2017   Depression, recurrent (HCC) 07/24/2016   Moderate asthma     PCP: Zarwolo, Gloria, FNP  REFERRING PROVIDER: Zarwolo, Gloria, FNP  REFERRING DIAG: M54.50 (ICD-10-CM) - Lumbar pain  Rationale for Evaluation and Treatment: Rehabilitation  THERAPY DIAG:  Other low back pain  Weakness of both lower extremities  Impaired functional mobility and activity tolerance  ONSET DATE: Years  SUBJECTIVE:                                                                                                                                                                                           SUBJECTIVE STATEMENT: Late sign in today. Pt reporting trying HEP, side plank on the Left side caused pt's concordant  pain  Pt states that she has dealt with back pain since middle school. Vitamin D2 just added to medication list. Pt states she was working at [employer] and had to lift stuff over head, now pt works at [employer] and its better but has to stand for long periods. Pt mentions sciatica was a word her doctor used last appointment. Pain in left leg today but has gone down right leg before. Pt states sciatica pain started on a trip where she had to walk over 5 miles, this is when she  started complaining about the pain everyday. Significant other states pain is the worst in the morning, and after work at night. Pt asks about best sleeping position.  PERTINENT HISTORY:  -Broke arm twice, abnormal sensation in LUE  PAIN:  Are you having pain? No  PRECAUTIONS: None  RED FLAGS: None   WEIGHT BEARING RESTRICTIONS: No  FALLS:  Has patient fallen in last 6 months? No   OCCUPATION: [employer]  PLOF: Independent  PATIENT GOALS: Pt would like to be able to walk and stand longer, work with out pain, back to normal. Would like to return to   NEXT MD VISIT: July  OBJECTIVE:  Note: Objective measures were completed at Evaluation unless otherwise noted.  DIAGNOSTIC FINDINGS:  CLINICAL DATA:  Chronic low back pain for 2 years.   EXAM: LUMBAR SPINE - COMPLETE 4+ VIEW   COMPARISON:  None Available.   FINDINGS: There is no evidence of lumbar spine fracture. Mild scoliosis. Intervertebral disc spaces are maintained.   IMPRESSION: No acute fracture or dislocation. Mild scoliosis.  PATIENT SURVEYS:  Modified Oswestry 19/50      SENSATION: Pt states numbness in left UE and occasional LLE during sciatica flare ups.   POSTURE: rounded shoulders and forward head  PALPATION: Increased tenderness noted to palpation of L1-L5 lumbar segments although no tenderness noted in lumbar paraspinals  LUMBAR ROM:   AROM eval  Flexion 70  Extension 10, pain in mid/lower back  Right lateral flexion 25   Left lateral flexion 25  Right rotation Full ROM  Left rotation Full ROM    (Blank rows = not tested)  LOWER EXTREMITY MMT:    MMT Right eval Left eval  Hip flexion 4 3, pain  Hip extension 4 4  Hip abduction 3+ 3+  Hip adduction    Hip internal rotation    Hip external rotation    Knee flexion    Knee extension    Ankle dorsiflexion    Ankle plantarflexion    Ankle inversion    Ankle eversion     (Blank rows = not tested)  LUMBAR SPECIAL TESTS:  Straight leg raise test: Negative and Slump test: Negative  FUNCTIONAL TESTS:  5 times sit to stand: 17.02 2 minute walk test: 375 feet  GAIT: Distance walked: 375 feet Assistive device utilized: None Level of assistance: Complete Independence Comments: pt demonstrates decreased arm swing bilaterally, increased knee valgus and toe in compensation on occasion bilaterally, pt reports increased pain at end of but does not demonstrate asymmetrical gait.  TREATMENT DATE:  08/04/2023  -curl up 10x10'' -bird dog 10 bilaterally with 5'' isometric hold -R side plank 5x10'' -modified standing L side plank 10x10'' -Standing shoulder extension with GTB and TA bracing 10x10'' -Standing palloff press with GTB 10x5'' bilaterally -Sumodeadlift x 5 w/ 5lb KB   07/27/2023  -Curl up 10x10'' -Side planks 5x10'' bilaterally -Bird dog x 7 bilaterally -Standing shoulder extension with RTB x 12 -hip hinge pattern education-breaking down squatting mechanics  07/21/23: Sidelying:  Clam with GTB 10x 5 (increased fatigue Lt vs Rt) Supine:  Bridge with GTB around thigh 15x 5  Physioball rectus abdominal 10x 5  Physioball obliques 10x 5 Seated:  Postural awareness  Educated lumbar support to assist with posture. Standing:   RTB shoulder extension 10x 5  RTB rows 10x 5     PATIENT EDUCATION:  Education details: Pt was educated on findings of PT evaluation, prognosis, frequency of therapy visits and rationale, attendance  policy,  and HEP if given.   Person educated: Patient Education method: Explanation, Verbal cues, and Handouts Education comprehension: verbalized understanding, tactile cues required, and needs further education  HOME EXERCISE PROGRAM: Access Code: [email or web address] URL: [email or web address] Date: 07/13/2023  Exercises - Supine Bridge with Mini Swiss Ball Between Knees  - 2 x daily - 7 x weekly - 2 sets - 10 reps - 5 sec hold - Hooklying Clamshell with Resistance  - 2 x daily - 7 x weekly - 2 sets - 10 reps - Seated Scapular Retraction  - 2 x daily - 7 x weekly - 1 sets - 10 reps - 5 sec hold  Access Code: [email or web address] URL: [email or web address] Date: 07/08/2023 Prepared by: Lang Ada  Exercises - Supine Bridge  - 1 x daily - 7 x weekly - 3 sets - 10 reps - Supine Lower Trunk Rotation  - 1 x daily - 7 x weekly - 3 sets - 10 reps - Sit to Stand Without Arm Support  - 1 x daily - 7 x weekly - 3 sets - 10 reps  ASSESSMENT:  CLINICAL IMPRESSION: Pt tolerating treatment session well. No increased pain with interventions and readdressed side planks. Added palloff press as well. Incorporate more lifting next session. Pt will benefit from skilled Physical Therapy services to address deficits/limitations in order to improve functional and QOL.    Eval: Patient is a 23 y.o. female who was seen today for physical therapy evaluation and treatment for M54.50 (ICD-10-CM) - Lumbar pain.  Patient demonstrates increased low back pain, decreased LE/core strength, abnormal gait pattern, and impaired balance. Patient also demonstrates difficulty with ambulation during today's session with decreased arm swing bilaterally, increased knee valgus and  toe in compensations, and decreased velocity noted. Patient also demonstrates LLE pain with extension and HEP prescription. Pt requires education on POC, role of PT, and best sleeping positions. Patient would benefit from skilled physical  therapy for increased endurance with ambulation, increased LE/core strength, and increased activity tolerance for improved gait quality, return to higher level of function with ADLs, and progress towards therapy goals.   OBJECTIVE IMPAIRMENTS: Abnormal gait, decreased activity tolerance, decreased balance, decreased endurance, decreased knowledge of use of DME, decreased mobility, difficulty walking, decreased ROM, decreased strength, and pain.   ACTIVITY LIMITATIONS: carrying, lifting, bending, sitting, and squatting  PARTICIPATION LIMITATIONS: meal prep, cleaning, laundry, shopping, community activity, occupation, and yard work  PERSONAL FACTORS: Age, Fitness, Past/current experiences, and Time since onset of injury/illness/exacerbation are also affecting patient's functional outcome.   REHAB POTENTIAL: Good  CLINICAL DECISION MAKING: Stable/uncomplicated  EVALUATION COMPLEXITY: Low   GOALS: Goals reviewed with patient? No  SHORT TERM GOALS: Target date: 07/29/23  Pt will be independent with HEP in order to demonstrate participation in Physical Therapy POC.  Baseline: Goal status: INITIAL  2.  Pt will report 5/10 pain at worst in last week with mobility in order to demonstrate improved pain with ADLs.  Baseline:  Goal status: INITIAL  LONG TERM GOALS: Target date: 08/19/23  Pt will improve 5TSTS by 2.3 seconds in order to demonstrate improved functional strength to return to desired activities.  Baseline: see objective.  Goal status: INITIAL  2.  Pt will improve 2 MWT by 40 feet in order to demonstrate improved functional ambulatory capacity in community setting.  Baseline: see objective.  Goal status: INITIAL  3.  Pt will improve Modified Oswestry score by 6 points in order to demonstrate improved pain with functional goals and outcomes. Baseline: see  objective.  Goal status: INITIAL  4.  Pt will report 3/10 pain at worst in last week with mobility in order to  demonstrate reduced pain with ADLs lasting greater than 30 minutes.  Baseline: see objective.  Goal status: INITIAL   PLAN:  PT FREQUENCY: 1-2x/week  PT DURATION: 6 weeks  PLANNED INTERVENTIONS: 97110-Therapeutic exercises, 97530- Therapeutic activity, 97112- Neuromuscular re-education, 97535- Self Care, 02859- Manual therapy, [phone]- Gait training, Patient/Family education, Balance training, Stair training, Joint mobilization, Spinal mobilization, DME instructions, Cryotherapy, and Moist heat.  PLAN FOR NEXT SESSION: progress core and LE strengthening.  Progress to standing postural/core stab exercises; body mechanics. Continue theraband postural strengthening and give to pt once good form.  Added squats next session.    Omega JONETTA Bottcher PT, DPT [facility] [phone] office  11:01 AM, 08/04/23

## 2023-08-06 ENCOUNTER — Ambulatory Visit: Payer: Self-pay | Admitting: Family Medicine

## 2023-08-06 LAB — CYTOLOGY - PAP
Chlamydia: NEGATIVE
Comment: NEGATIVE
Comment: NEGATIVE
Comment: NEGATIVE
Comment: NORMAL
Diagnosis: NEGATIVE
High risk HPV: NEGATIVE
Neisseria Gonorrhea: NEGATIVE
Trichomonas: NEGATIVE

## 2023-08-06 NOTE — Progress Notes (Signed)
 Please inform the patient: Your Pap examination was negative for intraepithelial lesion or malignancy this means that there were no signs of precancerous changes or cancer were found in the cervical cells sampled during the exam; this is a normal result indicating the cervical cells are healthy and there are no indications of cervical cancer or precancer conditions.

## 2023-08-11 ENCOUNTER — Encounter (HOSPITAL_COMMUNITY)

## 2023-08-18 ENCOUNTER — Encounter (HOSPITAL_COMMUNITY): Payer: Self-pay

## 2023-08-18 ENCOUNTER — Ambulatory Visit (HOSPITAL_COMMUNITY)

## 2023-08-18 DIAGNOSIS — R29898 Other symptoms and signs involving the musculoskeletal system: Secondary | ICD-10-CM

## 2023-08-18 DIAGNOSIS — Z7409 Other reduced mobility: Secondary | ICD-10-CM

## 2023-08-18 DIAGNOSIS — M5459 Other low back pain: Secondary | ICD-10-CM

## 2023-08-18 NOTE — Therapy (Addendum)
 OUTPATIENT PHYSICAL THERAPY TREATMENT/DISCHARGE  PHYSICAL THERAPY DISCHARGE SUMMARY  Visits from Start of Care: 6  Current functional level related to goals / functional outcomes: WFL   Remaining deficits: Pain   Education / Equipment: Please see below.   Patient agrees to discharge. Patient goals were partially met. Patient is being discharged due to meeting all but one of the stated rehab goals.  Patient Name: Krystal Carroll MRN: [id number] DOB:2000-03-01, 23 y.o., female Today's Date: 08/25/2023  END OF SESSION:    08/18/23 1153  PT Visits / Re-Eval  Visit Number 7  Number of Visits 12  Date for PT Re-Evaluation 08/20/23  Authorization  Authorization Type [place] MEDICAID [employer]  Authorization Time Period no authorization needed  Progress Note Due on Visit 10  PT Time Calculation  PT Start Time 1153  PT Stop Time 1235  PT Time Calculation (min) 42 min  PT - End of Session  Activity Tolerance Patient tolerated treatment well  Behavior During Therapy WFL for tasks assessed/performed        Past Medical History:  Diagnosis Date   Mild intermittent asthma    Seasonal allergies    Past Surgical History:  Procedure Laterality Date   ELBOW SURGERY Left    FRACTURE SURGERY     broken arm   Patient Active Problem List   Diagnosis Date Noted   Cervical cancer screening 07/29/2023   Uses oral contraception 07/29/2023   Lumbar pain 05/20/2023   GERD (gastroesophageal reflux disease) 01/17/2020   Seasonal allergies 06/21/2019   Encounter for surveillance of contraceptive pills 06/04/2019   Insomnia 11/26/2017   Depression, recurrent (HCC) 07/24/2016   Moderate asthma     PCP: Zarwolo, Gloria, FNP  REFERRING PROVIDER: Zarwolo, Gloria, FNP  REFERRING DIAG: M54.50 (ICD-10-CM) - Lumbar pain  Rationale for Evaluation and Treatment: Rehabilitation  THERAPY DIAG:  Other low back pain  Weakness of both lower extremities  Impaired  functional mobility and activity tolerance  ONSET DATE: Years  SUBJECTIVE:                                                                                                                                                                                           SUBJECTIVE STATEMENT: Reports improvements in the morning.  No reports of pain currently.  Continues to have pain while working, having to lift and carry objects frequently.    Pt states that she has dealt with back pain since middle school. Vitamin D2 just added to medication list. Pt states she was working at [employer] and had to lift stuff over head, now pt works at [employer] and  its better but has to stand for long periods. Pt mentions sciatica was a word her doctor used last appointment. Pain in left leg today but has gone down right leg before. Pt states sciatica pain started on a trip where she had to walk over 5 miles, this is when she started complaining about the pain everyday. Significant other states pain is the worst in the morning, and after work at night. Pt asks about best sleeping position.  PERTINENT HISTORY:  -Broke arm twice, abnormal sensation in LUE  PAIN:  Are you having pain? No  PRECAUTIONS: None  RED FLAGS: None   WEIGHT BEARING RESTRICTIONS: No  FALLS:  Has patient fallen in last 6 months? No   OCCUPATION: [employer]  PLOF: Independent  PATIENT GOALS: Pt would like to be able to walk and stand longer, work with out pain, back to normal. Would like to return to   NEXT MD VISIT: July  OBJECTIVE:  Note: Objective measures were completed at Evaluation unless otherwise noted.  DIAGNOSTIC FINDINGS:  CLINICAL DATA:  Chronic low back pain for 2 years.   EXAM: LUMBAR SPINE - COMPLETE 4+ VIEW   COMPARISON:  None Available.   FINDINGS: There is no evidence of lumbar spine fracture. Mild scoliosis. Intervertebral disc spaces are maintained.   IMPRESSION: No acute fracture or dislocation. Mild  scoliosis.  PATIENT SURVEYS:  Modified Oswestry 19/50  Modified Oswestry Low Back Pain Disability Questionnaire: 12 / 50 = 24.0 %     SENSATION: Pt states numbness in left UE and occasional LLE during sciatica flare ups.   POSTURE: rounded shoulders and forward head  PALPATION: Increased tenderness noted to palpation of L1-L5 lumbar segments although no tenderness noted in lumbar paraspinals  LUMBAR ROM:   AROM eval  Flexion 70  Extension 10, pain in mid/lower back  Right lateral flexion 25  Left lateral flexion 25  Right rotation Full ROM  Left rotation Full ROM    (Blank rows = not tested)  LOWER EXTREMITY MMT:    MMT Right eval Left eval Right 08/18/23: Left 08/18/23  Hip flexion 4 3, pain 4+ 4+  Hip extension 4 4 4+ 4/5  Hip abduction 3+ 3+ 4+ 4+  Hip adduction      Hip internal rotation      Hip external rotation      Knee flexion   5 5  Knee extension      Ankle dorsiflexion      Ankle plantarflexion      Ankle inversion      Ankle eversion       (Blank rows = not tested)  LUMBAR SPECIAL TESTS:  Straight leg raise test: Negative and Slump test: Negative  FUNCTIONAL TESTS:  5 times sit to stand: 17.02 2 minute walk test: 375 feet 08/18/23: 451ft no AD pain free  5STS 14.31   GAIT: Distance walked: 375 feet Assistive device utilized: None Level of assistance: Complete Independence Comments: pt demonstrates decreased arm swing bilaterally, increased knee valgus and toe in compensation on occasion bilaterally, pt reports increased pain at end of but does not demonstrate asymmetrical gait.  TREATMENT DATE:  08/17/21: 450ft was 332ft 5STS 14.31  Modified Oswestry Low Back Pain Disability Questionnaire: 12 / 50 = 24.0 % MMT ROM Squat 10x cueing for form/mechanics  08/04/2023  -curl up 10x10'' -bird dog 10 bilaterally with 5'' isometric hold -R side plank 5x10'' -modified standing L side plank 10x10'' -Standing shoulder extension  with  GTB and TA bracing 10x10'' -Standing palloff press with GTB 10x5'' bilaterally -Sumodeadlift x 5 w/ 5lb KB   07/27/2023  -Curl up 10x10'' -Side planks 5x10'' bilaterally -Bird dog x 7 bilaterally -Standing shoulder extension with RTB x 12 -hip hinge pattern education-breaking down squatting mechanics  07/21/23: Sidelying:  Clam with GTB 10x 5 (increased fatigue Lt vs Rt) Supine:  Bridge with GTB around thigh 15x 5  Physioball rectus abdominal 10x 5  Physioball obliques 10x 5 Seated:  Postural awareness  Educated lumbar support to assist with posture. Standing:   RTB shoulder extension 10x 5  RTB rows 10x 5     PATIENT EDUCATION:  Education details: Pt was educated on findings of PT evaluation, prognosis, frequency of therapy visits and rationale, attendance policy, and HEP if given.   Person educated: Patient Education method: Explanation, Verbal cues, and Handouts Education comprehension: verbalized understanding, tactile cues required, and needs further education  HOME EXERCISE PROGRAM: Access Code: [email or web address] URL: [email or web address] Date: 07/13/2023  Exercises - Supine Bridge with Mini Swiss Ball Between Knees  - 2 x daily - 7 x weekly - 2 sets - 10 reps - 5 sec hold - Hooklying Clamshell with Resistance  - 2 x daily - 7 x weekly - 2 sets - 10 reps - Seated Scapular Retraction  - 2 x daily - 7 x weekly - 1 sets - 10 reps - 5 sec hold  Access Code: [email or web address] URL: [email or web address] Date: 07/08/2023 Prepared by: Lang Ada  Exercises - Supine Bridge  - 1 x daily - 7 x weekly - 3 sets - 10 reps - Supine Lower Trunk Rotation  - 1 x daily - 7 x weekly - 3 sets - 10 reps - Sit to Stand Without Arm Support  - 1 x daily - 7 x weekly - 3 sets - 10 reps  08/18/23: Squat   ASSESSMENT:  CLINICAL IMPRESSION: Reviewed goals this session with the following findings:  Pt has met 2/2 STGs and 3/4 LTGs.  Pt presents with increased  cadence, improved strength, ROM WNL all directions pain free.  Presents with improved awareness with posture and imporved self perceived functional abilities noted with Oswestry survery.  Pt continues to have pain with walking/standing long duration and carrying objects.  This session reviewed squat and lifting mechanics with good following through.  Added squats to HEP.  Plan for DC to HEP.   Eval: Patient is a 23 y.o. female who was seen today for physical therapy evaluation and treatment for M54.50 (ICD-10-CM) - Lumbar pain.  Patient demonstrates increased low back pain, decreased LE/core strength, abnormal gait pattern, and impaired balance. Patient also demonstrates difficulty with ambulation during today's session with decreased arm swing bilaterally, increased knee valgus and  toe in compensations, and decreased velocity noted. Patient also demonstrates LLE pain with extension and HEP prescription. Pt requires education on POC, role of PT, and best sleeping positions. Patient would benefit from skilled physical therapy for increased endurance with ambulation, increased LE/core strength, and increased activity tolerance for improved gait quality, return to higher level of function with ADLs, and progress towards therapy goals.   OBJECTIVE IMPAIRMENTS: Abnormal gait, decreased activity tolerance, decreased balance, decreased endurance, decreased knowledge of use of DME, decreased mobility, difficulty walking, decreased ROM, decreased strength, and pain.   ACTIVITY LIMITATIONS: carrying, lifting, bending, sitting, and squatting  PARTICIPATION LIMITATIONS: meal prep, cleaning, laundry, shopping, community activity, occupation, and yard work  PERSONAL FACTORS: Age, Fitness, Past/current experiences, and Time since  onset of injury/illness/exacerbation are also affecting patient's functional outcome.   REHAB POTENTIAL: Good  CLINICAL DECISION MAKING: Stable/uncomplicated  EVALUATION COMPLEXITY:  Low   GOALS: Goals reviewed with patient? No  SHORT TERM GOALS: Target date: 07/29/23  Pt will be independent with HEP in order to demonstrate participation in Physical Therapy POC.  Baseline:  08/18/23:  Reports compliance with HEP, admits to completing 3-4 days a week.  Has been practicing posture daily. Goal status: MET  2.  Pt will report 5/10 pain at worst in last week with mobility in order to demonstrate improved pain with ADLs.  Baseline: 08/18/23:  Pain no worse than 5/10. Goal status: MET  LONG TERM GOALS: Target date: 08/19/23  Pt will improve 5TSTS by 2.3 seconds in order to demonstrate improved functional strength to return to desired activities.  Baseline: see objective. ;08/18/23: 5STS 14.31 Goal status: MET  2.  Pt will improve 2 MWT by 40 feet in order to demonstrate improved functional ambulatory capacity in community setting.  Baseline: see objective.; 08/18/23: 471ft no AD pain free Goal status: MET  3.  Pt will improve Modified Oswestry score by 6 points in order to demonstrate improved pain with functional goals and outcomes. Baseline: see objective.; Modified Oswestry Low Back Pain Disability Questionnaire: 12 / 50 = 24.0 % improved 7 point Goal status: MET  4.  Pt will report 3/10 pain at worst in last week with mobility in order to demonstrate reduced pain with ADLs lasting greater than 30 minutes.  Baseline: see objective.; 08/18/23:  pain scale 5/10 work based duties Goal status: IN PROGRESS   PLAN:  PT FREQUENCY: 1-2x/week  PT DURATION: 6 weeks  PLANNED INTERVENTIONS: 97110-Therapeutic exercises, 97530- Therapeutic activity, [id number]- Neuromuscular re-education, [phone]- Self Care, 02859- Manual therapy, [phone]- Gait training, Patient/Family education, Balance training, Stair training, Joint mobilization, Spinal mobilization, DME instructions, Cryotherapy, and Moist heat.  PLAN FOR NEXT SESSION: DC to HEP.     Augustin Mclean, LPTA/CLT;  [facility] [phone]  2:19 PM, 08/25/23

## 2023-08-24 ENCOUNTER — Encounter (HOSPITAL_COMMUNITY): Admitting: Physical Therapy

## 2023-09-06 ENCOUNTER — Telehealth: Payer: Self-pay

## 2023-09-06 NOTE — Telephone Encounter (Signed)
 Copied from CRM #[id number]. Topic: General - Other >> Sep 06, 2023 10:46 AM Delon HERO wrote: Reason for CRM: Patient is calling to request a work note stating that the patient has sciatica. Requesting note to state that she can not work 4 days in a row. And needs 2 day rest following the 4th day.  Requesting note to be uploaded into her MyChart.

## 2023-12-29 ENCOUNTER — Other Ambulatory Visit: Payer: Self-pay | Admitting: Family Medicine

## 2023-12-29 DIAGNOSIS — Z3041 Encounter for surveillance of contraceptive pills: Secondary | ICD-10-CM

## 2024-01-05 ENCOUNTER — Other Ambulatory Visit: Payer: Self-pay | Admitting: Family Medicine

## 2024-01-05 DIAGNOSIS — N912 Amenorrhea, unspecified: Secondary | ICD-10-CM

## 2024-01-08 LAB — PREGNANCY, URINE: Preg Test, Ur: NEGATIVE

## 2024-01-10 ENCOUNTER — Other Ambulatory Visit: Payer: Self-pay | Admitting: Family Medicine

## 2024-01-10 DIAGNOSIS — Z3041 Encounter for surveillance of contraceptive pills: Secondary | ICD-10-CM

## 2024-01-10 MED ORDER — NORGESTIM-ETH ESTRAD TRIPHASIC 0.18/0.215/0.25 MG-35 MCG PO TABS: 1.0000 | ORAL_TABLET | Freq: Every day | ORAL | 1 refills | Status: AC

## 2024-01-10 NOTE — Telephone Encounter (Signed)
 Kindly inform the patient that a refill of her birth control pills has been sent to the pharmacy.

## 2024-01-31 ENCOUNTER — Telehealth: Admitting: Family Medicine

## 2024-01-31 DIAGNOSIS — E7849 Other hyperlipidemia: Secondary | ICD-10-CM

## 2024-01-31 DIAGNOSIS — R7301 Impaired fasting glucose: Secondary | ICD-10-CM

## 2024-01-31 DIAGNOSIS — E559 Vitamin D deficiency, unspecified: Secondary | ICD-10-CM

## 2024-01-31 DIAGNOSIS — E038 Other specified hypothyroidism: Secondary | ICD-10-CM | POA: Diagnosis not present

## 2024-01-31 NOTE — Assessment & Plan Note (Addendum)
 Pending laboratory results.  Treatment adjustments will be made based on the lab findings. She was encouraged to increase her intake of vitamin D -rich foods, including fatty fish (such as salmon, mackerel, and sardines), fortified dairy products, egg yolks, and fortified cereals.

## 2024-01-31 NOTE — Progress Notes (Signed)
 "  Virtual Visit via Video Note  I connected with Krystal Carroll on 01/31/2024 at  9:00 AM EST by a video enabled telemedicine application and verified that I am speaking with the correct person using two identifiers.  Patient Location: Home Provider Location: Home Office  I discussed the limitations, risks, security, and privacy concerns of performing an evaluation and management service by video and the availability of in person appointments. I also discussed with the patient that there may be a patient responsible charge related to this service. The patient expressed understanding and agreed to proceed.  Subjective: PCP: Edman Meade PEDLAR, FNP  Chief Complaint  Patient presents with   Medical Management of Chronic Issues    Six month follow up    HPI The patient presents today with complaints of headaches that occurred while taking her weekly vitamin D  supplement, which prompted her to discontinue the medication. She denies any other complaints or concerns at this time.   ROS: Per HPI Current Medications[1]  Observations/Objective: There were no vitals filed for this visit. Physical Exam Patient is well-developed, well-nourished in no acute distress.  Resting comfortably at home.  Head is normocephalic, atraumatic.  No labored breathing.  Speech is clear and coherent with logical content.  Patient is alert and oriented at baseline.   Assessment and Plan: Vitamin D  deficiency Assessment & Plan: Pending laboratory results.  Treatment adjustments will be made based on the lab findings. She was encouraged to increase her intake of vitamin D -rich foods, including fatty fish (such as salmon, mackerel, and sardines), fortified dairy products, egg yolks, and fortified cereals.   Orders: -     VITAMIN D  25 Hydroxy (Vit-D Deficiency, Fractures)  IFG (impaired fasting glucose) -     Hemoglobin A1c  TSH (thyroid-stimulating hormone deficiency) -     TSH + free T4  Other  hyperlipidemia -     Lipid panel -     CMP14+EGFR -     CBC with Differential/Platelet    Follow Up Instructions: Return in about 8 months (around 09/30/2024).   I discussed the assessment and treatment plan with the patient. The patient was provided an opportunity to ask questions, and all were answered. The patient agreed with the plan and demonstrated an understanding of the instructions.   The patient was advised to call back or seek an in-person evaluation if the symptoms worsen or if the condition fails to improve as anticipated.  The above assessment and management plan was discussed with the patient. The patient verbalized understanding of and has agreed to the management plan.   Ali Mohl  Z Bacchus, FNP    [1]  Current Outpatient Medications:    albuterol  (PROVENTIL  HFA;VENTOLIN  HFA) 108 (90 Base) MCG/ACT inhaler, Inhale 1-2 puffs into the lungs every 6 (six) hours as needed for wheezing or shortness of breath. (Patient not taking: Reported on 05/20/2023), Disp: 1 Inhaler, Rfl: 0   albuterol  (VENTOLIN  HFA) 108 (90 Base) MCG/ACT inhaler, Inhale 1-2 puffs into the lungs every 6 (six) hours as needed for wheezing or shortness of breath., Disp: 8 g, Rfl: 2   fluticasone -salmeterol (ADVAIR) 100-50 MCG/ACT AEPB, Inhale 1 puff into the lungs 2 (two) times daily., Disp: 1 each, Rfl: 3   Fluticasone -Salmeterol (ADVAIR) 100-50 MCG/DOSE AEPB, Inhale 1 puff into the lungs 2 (two) times daily. (Patient not taking: Reported on 05/20/2023), Disp: 1 each, Rfl: 5   Norgestimate-Ethinyl Estradiol Triphasic (TRI-ESTARYLLA) 0.18/0.215/0.25 MG-35 MCG tablet, Take 1 tablet by mouth daily.,  Disp: 84 tablet, Rfl: 1   omeprazole  (PRILOSEC) 20 MG capsule, Take 1 capsule (20 mg total) by mouth daily. (Patient not taking: Reported on 05/20/2023), Disp: 30 capsule, Rfl: 3   ondansetron  (ZOFRAN  ODT) 4 MG disintegrating tablet, Take 1 tablet (4 mg total) by mouth every 8 (eight) hours as needed for nausea or vomiting.  (Patient not taking: Reported on 05/20/2023), Disp: 20 tablet, Rfl: 1   polyethylene glycol powder (GLYCOLAX /MIRALAX ) 17 GM/SCOOP powder, Mix one 17gram scoop in one 8oz glass of water. Drink this daily/ (Patient not taking: Reported on 05/20/2023), Disp: 578 g, Rfl: 1   sucralfate  (CARAFATE ) 1 g tablet, Take 1 tablet (1 g total) by mouth 4 (four) times daily -  with meals and at bedtime. (Patient not taking: Reported on 05/20/2023), Disp: 120 tablet, Rfl: 1   venlafaxine  XR (EFFEXOR -XR) 37.5 MG 24 hr capsule, TAKE 1 CAPSULE BY MOUTH DAILY WITH BREAKFAST. (Patient not taking: Reported on 05/20/2023), Disp: 10 capsule, Rfl: 0   Vitamin D , Ergocalciferol , (DRISDOL ) 1.25 MG (50000 UNIT) CAPS capsule, Take 1 capsule (50,000 Units total) by mouth every 7 (seven) days., Disp: 20 capsule, Rfl: 1  "

## 2024-10-04 ENCOUNTER — Ambulatory Visit: Payer: Self-pay
